# Patient Record
Sex: Male | Born: 1985 | Race: Black or African American | Hispanic: No | Marital: Single | Smoking: Former smoker
Health system: Southern US, Community
[De-identification: ages and names within clinical notes are randomized; demographics above are authoritative.]

## PROBLEM LIST (undated history)

## (undated) DIAGNOSIS — J40 Bronchitis, not specified as acute or chronic: Secondary | ICD-10-CM

## (undated) DIAGNOSIS — L0291 Cutaneous abscess, unspecified: Secondary | ICD-10-CM

## (undated) DIAGNOSIS — F32A Depression, unspecified: Secondary | ICD-10-CM

## (undated) DIAGNOSIS — T7840XA Allergy, unspecified, initial encounter: Secondary | ICD-10-CM

## (undated) DIAGNOSIS — M5441 Lumbago with sciatica, right side: Secondary | ICD-10-CM

## (undated) DIAGNOSIS — F419 Anxiety disorder, unspecified: Secondary | ICD-10-CM

## (undated) DIAGNOSIS — M5442 Lumbago with sciatica, left side: Secondary | ICD-10-CM

## (undated) HISTORY — DX: Anxiety disorder, unspecified: F41.9

## (undated) HISTORY — DX: Allergy, unspecified, initial encounter: T78.40XA

## (undated) HISTORY — DX: Depression, unspecified: F32.A

---
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
## 2007-02-28 ENCOUNTER — Emergency Department (HOSPITAL_COMMUNITY): Admission: EM | Admit: 2007-02-28 | Discharge: 2007-02-28 | Payer: Self-pay | Admitting: Emergency Medicine

## 2007-08-22 ENCOUNTER — Emergency Department (HOSPITAL_COMMUNITY): Admission: EM | Admit: 2007-08-22 | Discharge: 2007-08-22 | Payer: Self-pay | Admitting: Emergency Medicine

## 2008-06-20 ENCOUNTER — Emergency Department (HOSPITAL_COMMUNITY): Admission: EM | Admit: 2008-06-20 | Discharge: 2008-06-20 | Payer: Self-pay | Admitting: Family Medicine

## 2009-05-17 ENCOUNTER — Emergency Department (HOSPITAL_COMMUNITY): Admission: EM | Admit: 2009-05-17 | Discharge: 2009-05-17 | Payer: Self-pay | Admitting: Family Medicine

## 2009-09-19 ENCOUNTER — Emergency Department (HOSPITAL_COMMUNITY): Admission: EM | Admit: 2009-09-19 | Discharge: 2009-09-19 | Payer: Self-pay | Admitting: Emergency Medicine

## 2010-09-10 LAB — POCT URINALYSIS DIP (DEVICE)
Bilirubin Urine: NEGATIVE
Glucose, UA: NEGATIVE mg/dL
Hgb urine dipstick: NEGATIVE
Ketones, ur: NEGATIVE mg/dL
Nitrite: NEGATIVE
Protein, ur: NEGATIVE mg/dL
Specific Gravity, Urine: 1.015 (ref 1.005–1.030)
Urobilinogen, UA: 0.2 mg/dL (ref 0.0–1.0)
pH: 8 (ref 5.0–8.0)

## 2010-11-20 ENCOUNTER — Inpatient Hospital Stay (INDEPENDENT_AMBULATORY_CARE_PROVIDER_SITE_OTHER)
Admission: RE | Admit: 2010-11-20 | Discharge: 2010-11-20 | Disposition: A | Discharge status: Home / Self Care | Payer: Self-pay | Source: Ambulatory Visit | Attending: Emergency Medicine | Admitting: Emergency Medicine

## 2010-11-20 DIAGNOSIS — L03317 Cellulitis of buttock: Secondary | ICD-10-CM

## 2010-11-20 DIAGNOSIS — L0231 Cutaneous abscess of buttock: Secondary | ICD-10-CM

## 2010-12-24 ENCOUNTER — Emergency Department (HOSPITAL_COMMUNITY)
Admission: EM | Admit: 2010-12-24 | Discharge: 2010-12-25 | Disposition: A | Discharge status: Home / Self Care | Payer: Self-pay | Attending: Emergency Medicine | Admitting: Emergency Medicine

## 2010-12-24 DIAGNOSIS — R51 Headache: Secondary | ICD-10-CM | POA: Insufficient documentation

## 2010-12-24 DIAGNOSIS — R1110 Vomiting, unspecified: Secondary | ICD-10-CM | POA: Insufficient documentation

## 2010-12-24 LAB — URINALYSIS, ROUTINE W REFLEX MICROSCOPIC
Glucose, UA: NEGATIVE mg/dL
Ketones, ur: NEGATIVE mg/dL
Leukocytes, UA: NEGATIVE
Nitrite: NEGATIVE
Protein, ur: NEGATIVE mg/dL
pH: 6 (ref 5.0–8.0)

## 2010-12-25 ENCOUNTER — Emergency Department (HOSPITAL_COMMUNITY): Payer: Self-pay

## 2010-12-26 ENCOUNTER — Inpatient Hospital Stay (INDEPENDENT_AMBULATORY_CARE_PROVIDER_SITE_OTHER)
Admission: RE | Admit: 2010-12-26 | Discharge: 2010-12-26 | Disposition: A | Discharge status: Home / Self Care | Payer: Self-pay | Source: Ambulatory Visit | Attending: Emergency Medicine | Admitting: Emergency Medicine

## 2010-12-26 DIAGNOSIS — K612 Anorectal abscess: Secondary | ICD-10-CM

## 2010-12-29 LAB — CULTURE, ROUTINE-ABSCESS

## 2011-02-18 LAB — URINALYSIS, ROUTINE W REFLEX MICROSCOPIC
Leukocytes, UA: NEGATIVE
Nitrite: NEGATIVE
Protein, ur: 30 — AB
Specific Gravity, Urine: 1.035 — ABNORMAL HIGH
Urobilinogen, UA: 1

## 2011-02-18 LAB — URINE MICROSCOPIC-ADD ON

## 2011-03-07 LAB — CULTURE, ROUTINE-ABSCESS

## 2011-10-18 ENCOUNTER — Encounter (HOSPITAL_COMMUNITY): Payer: Self-pay | Admitting: Physical Medicine and Rehabilitation

## 2011-10-18 ENCOUNTER — Emergency Department (HOSPITAL_COMMUNITY)
Admission: EM | Admit: 2011-10-18 | Discharge: 2011-10-18 | Disposition: A | Discharge status: Home / Self Care | Payer: Self-pay | Attending: Emergency Medicine | Admitting: Emergency Medicine

## 2011-10-18 DIAGNOSIS — F17200 Nicotine dependence, unspecified, uncomplicated: Secondary | ICD-10-CM | POA: Insufficient documentation

## 2011-10-18 DIAGNOSIS — S6390XA Sprain of unspecified part of unspecified wrist and hand, initial encounter: Secondary | ICD-10-CM | POA: Insufficient documentation

## 2011-10-18 DIAGNOSIS — S66919A Strain of unspecified muscle, fascia and tendon at wrist and hand level, unspecified hand, initial encounter: Secondary | ICD-10-CM

## 2011-10-18 DIAGNOSIS — X58XXXA Exposure to other specified factors, initial encounter: Secondary | ICD-10-CM | POA: Insufficient documentation

## 2011-10-18 MED ORDER — NAPROXEN 500 MG PO TABS: 500.0000 mg | ORAL_TABLET | Freq: Two times a day (BID) | ORAL | Status: DC

## 2011-10-18 NOTE — Discharge Instructions (Signed)
Repetitive Strain Injuries  Repetitive strain injuries (RSIs) are now the single largest cause of job related (occupational) health problems in the [place] RSIs can occur in any job that requires repetitive, forceful, or awkward motions. Repetitive strain injuries are a group of health problems that result from several causes. These include overuse or misuse of muscles, cord-like structures attaching muscle to bone (tendons), and nerves.  CAUSES   Job-related RSIs are caused by any combination of the following factors:   Fast pace (working quickly).   Repetitive tasks (making the same motion over and over).   Awkward or fixed posture (working in an awkward position or holding the same position for a long time).   Forceful movements (lifting, pulling, or pushing).   Vibration (caused by power tools).   Working in cold temperatures.   Job stress (such as monitoring).   Inadequate rest breaks (overuse).  RSIs develop over time and are also called cumulative trauma disorders (CTDs). Repetitive strain injuries have other names, too. These include:   Occupational overuse syndrome.   Repetitive motion disorders.   Repetitive damage caused by an accident (trauma) disorders (RTDs).  RSIs can affect almost any part of the body. But they often occur in the upper body. The most commonly affected body parts are:    Fingers.   Elbows.   Hands.   Shoulders.   Wrists.   Back.   Arms.   Neck.  RISK INCREASES WITH:  You may be at risk for developing an RSI, if you:   Have poor posture.   Have poor technique.   Use a computer more than two to four hours a day.   Have a job that requires constant computer use.   Do not take frequent breaks.   Are loose-jointed.   Do not exercise regularly.   Work in a high pressure environment.   Have arthritis, diabetes, or another serious medical condition.   Keep your fingernails long.   Have an unhealthy, stressful, or inactive lifestyle.   Weigh more than you  should.   Do not sleep well.   Are unwilling to ask for a better work setting, chair, desk, etc.   Are macho, and don't believe you are at risk when you really are, or think that you can just "take it."  SYMPTOMS   These problems (symptoms) may appear in any order, and at any stage in the development of the injury. Symptoms may occur at any time: during work, right after work, or many hours or days after work. Many people first experience symptoms when they are not working. For example, an injured worker may have no pain at work, but may wake up at night with a painful shoulder or elbow.  The most common symptoms are:   Burning, shooting, or aching pain, especially in the fingers, palms, wrists, forearms, or shoulders.   Tenderness.   Swelling.   Tingling, numbness, or loss of feeling.   Loss of joint mobility (difficulty moving the wrist or elbow, for example).   Weakness, heaviness, or loss of coordination in the hand (for example, difficulty opening a jar top).   Crackling.   Muscle spasms.   Decreased coordination, or clumsiness (for example, dropping things often).   Avoiding the use of one hand or arm, because it is painful, and preferring the other.   Difficulty doing simple things like handling keys, chopping food, putting on jewelry, writing, or brushing teeth.  Any jobs that require strain and repetition pose a   risk of RSI. There are many different repetitive strain injuries, since many different parts of the body can be affected. RSI symptoms can be mild. But they can become so intense that it becomes difficult to perform everyday tasks. These tasks include opening a jar or fastening a button. In general, the more intense and frequent the symptoms, the more serious the RSI is likely to be. A serious RSI can develop only months after symptoms first appear, or it could take years to develop.   Most RSIs are work-related. But RSIs can be caused by activities outside of work, such as sports and  hobbies. Older people are more vulnerable than younger ones to RSIs. This is because the body's ability to repair the effects of wear and tear decreases with age.   If you think your repetitive strain disorder is getting worse, or you are developing one, see your caregiver for advice. Often, if treated early, the amount and length of disability can be shortened.  HOME CARE INSTRUCTIONS   If your caregiver prescribed medicine to help reduce swelling, take as directed.   If you were given a splint to keep your wrist from bending (such as for carpal tunnel syndrome), use it as instructed. It is important to wear the splint at night. Use splints for as long as your caregiver recommends.   It is important to give your injury a rest by stopping the activities that are causing the problem. If your symptoms are work-related, you may need to talk to your employer about changing to a job that does not require using your injured part.   Only take over-the-counter or prescription medicines for pain, discomfort, or fever as directed by your caregiver.   Following periods of extended use, especially strenuous use, apply an ice pack wrapped in a towel to the affected (sore) area for 15 to 20 minutes. Repeat as needed, three to four times per day. This will help reduce the swelling.   Call your caregiver if you develop new, unexplained symptoms or problems that are not responding to medicines.  When non-surgical treatment does not help, surgery may be required. Non-surgical treatment could include:   Changes in the activity that caused the problem or made it worse.   Medicines to stop the swelling and soreness (anti-inflammatory medications).   Injections such as cortisone, to decrease the inflammation and soreness.  Your caregiver will help you determine which is best for you.  PREVENTION  An RSI can take months, even years to develop, and it can take longer to heal. But there are ways to prevent RSIs,  including:   Maintain good posture at your desk or work station with:   Feet flat on the floor.   Knees directly over feet, bent at right angles (or slightly greater), a couple inches of space away from the chair.   Pelvis rocked forward, sitting on the "sitz bones," with hips no lower than, and perhaps slightly higher than the knees.   Lower back arched in, and supported by your chair, or a towel/foam roll wedged against your back.   Upper back naturally rounded.   Shoulders and arms relaxed, and at your side.   Neck arched in, relaxed, and supported by your spine. Do not hold tension in your back or under your chin.   Head balancing gently on top of your spine.   A good quality, adjustable chair with a firm seat.   Set up your work station well, so that it reduces   strain. Make sure your:   Keyboard is above your thighs. You are able to reach the keys, with your elbows at your side and bent at (slightly greater than) 90 degrees, and your forearms are about parallel to the ground.   Mouse is just to one side of your keyboard. You do not need to lean, stretch, or hunch to work it. Many people have one shoulder lower than the other. This can be caused by repetitive stretching for a mouse.   Monitor is directly in front of you (not off to the side), so that your eyes are somewhere between the top of the screen and one fifth of the way down from the top. The screen should be about 15 to 25 inches from your eyes.   Take breaks often, at least once an hour. Get up, walk around, and stretch.   Exercise regularly.   Only use your computer as much as you need to for work. Do not use it during breaks.   Do not hold your pen tightly when writing. It should be possible to pull the pen from your fingers, easily.   Let your hands float above the keyboard when you type. Move your entire arm when moving your mouse or typing hard-to-reach keys. Always keep your wrist joint straight. This lets the big muscles in  your arm, shoulder, and back do the work, instead of the smaller and weaker ones in your hand and wrist.  Document Released: 05/03/2002 Document Revised: 05/02/2011 Document Reviewed: 03/20/2009  [employer] Patient Information 2012 [employer].

## 2011-10-18 NOTE — ED Notes (Signed)
Pt presents to department for evaluation of R hand pain and swelling. Recently started new job working outside. 7/10 pain throbbing pain upon arrival. No obvious deformities noted. He is alert and oriented x4. No signs of distress,

## 2011-10-18 NOTE — ED Provider Notes (Signed)
History     CSN: [id number]  Arrival date & time 10/18/11  1514   First MD Initiated Contact with Patient 10/18/11 [phone]      Chief Complaint  Patient presents with  . Hand Pain    (Consider location/radiation/quality/duration/timing/severity/associated sxs/prior treatment) Patient is a 26 y.o. male presenting with hand pain. The history is provided by the patient. No language interpreter was used.  Hand Pain This is a new problem. The current episode started 1 to 4 weeks ago. The problem occurs 2 to 4 times per day. The problem has been gradually worsening. Associated symptoms include arthralgias and weakness. Pertinent negatives include no fever or numbness. He has tried ice and heat for the symptoms. The treatment provided mild relief.  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  Patient is also a [employer]or, [employer].  No past medical history on file.  No past surgical history on file.  History reviewed. No pertinent family history.  History  Substance Use Topics  . Smoking status: Current Everyday Smoker -- 1.0 packs/day    Types: Cigarettes  . Smokeless tobacco: Not on file  . Alcohol Use: No      Review of Systems  Constitutional: Negative for fever.  Musculoskeletal: Positive for arthralgias.  Neurological: Positive for weakness. Negative for numbness.  All other systems reviewed and are negative.    Allergies  Review of patient's allergies indicates no known allergies.  Home Medications   Current Outpatient Rx  Name Route Sig Dispense Refill  . DIPHENHYDRAMINE HCL 25 MG PO TABS Oral Take 25 mg by mouth at bedtime as needed. For seasonal allergies    . NAPHAZOLINE-GLYCERIN 0.012-0.2 % OP SOLN Both Eyes Place 1 drop into both eyes every 4 (four) hours as needed. For seasonal allergies       BP 117/77  Pulse 59  Temp(Src) 98 F (36.7 C) (Oral)  Resp 18  SpO2 99%  Physical Exam  Nursing note and vitals reviewed. Constitutional: He is oriented to person, place, and time. He appears well-developed and well-nourished. No distress.  HENT:  Head: Normocephalic and atraumatic.  Eyes: Pupils are equal, round, and reactive to light.  Neck: Normal range of motion. Neck supple.  Cardiovascular: Normal rate, regular rhythm, normal heart sounds and intact distal pulses.   Pulmonary/Chest: Effort normal and breath sounds normal.  Abdominal: Soft. Bowel sounds are normal.  Musculoskeletal: He exhibits edema and tenderness.       Right hand: He exhibits tenderness and swelling. He exhibits normal range of motion, normal capillary refill and no deformity. normal sensation noted.       Hands: Lymphadenopathy:    He has no cervical adenopathy.  Neurological: He is alert and oriented to person, place, and time.  Skin: Skin is warm and dry. No rash noted. No erythema.  Psychiatric: He has a normal mood and affect. His behavior is normal. Judgment and thought content normal.    ED Course  Procedures (including critical care time)  Labs Reviewed - No data to display No results found.   No diagnosis found.  Hand strain d/t repetitive motion  MDM          Jimmye Norman, NP 10/18/11 1623

## 2011-10-18 NOTE — ED Provider Notes (Signed)
Medical screening examination/treatment/procedure(s) were performed by non-physician practitioner and as supervising physician I was immediately available for consultation/collaboration.  Shelda Jakes, MD 10/18/11 [phone]

## 2012-01-15 ENCOUNTER — Emergency Department (HOSPITAL_COMMUNITY)
Admission: EM | Admit: 2012-01-15 | Discharge: 2012-01-15 | Disposition: A | Discharge status: Home / Self Care | Payer: Self-pay | Attending: Emergency Medicine | Admitting: Emergency Medicine

## 2012-01-15 ENCOUNTER — Encounter (HOSPITAL_COMMUNITY): Payer: Self-pay | Admitting: *Deleted

## 2012-01-15 DIAGNOSIS — F17200 Nicotine dependence, unspecified, uncomplicated: Secondary | ICD-10-CM | POA: Insufficient documentation

## 2012-01-15 DIAGNOSIS — L0231 Cutaneous abscess of buttock: Secondary | ICD-10-CM | POA: Insufficient documentation

## 2012-01-15 DIAGNOSIS — L03317 Cellulitis of buttock: Secondary | ICD-10-CM | POA: Insufficient documentation

## 2012-01-15 MED ORDER — HYDROCODONE-ACETAMINOPHEN 5-325 MG PO TABS
1.0000 | ORAL_TABLET | Freq: Once | ORAL | Status: AC
  Administered 2012-01-15: 1 via ORAL
  Filled 2012-01-15: qty 1

## 2012-01-15 MED ORDER — HYDROCODONE-ACETAMINOPHEN 5-325 MG PO TABS: ORAL_TABLET | ORAL | Status: DC

## 2012-01-15 MED ORDER — SULFAMETHOXAZOLE-TRIMETHOPRIM 800-160 MG PO TABS: 1.0000 | ORAL_TABLET | Freq: Two times a day (BID) | ORAL | Status: DC

## 2012-01-15 MED ORDER — LIDOCAINE-EPINEPHRINE (PF) 2 %-1:200000 IJ SOLN
10.0000 mL | Freq: Once | INTRAMUSCULAR | Status: AC
  Administered 2012-01-15: 10 mL

## 2012-01-15 NOTE — ED Notes (Signed)
Wound dressed with gauze and paper tape.

## 2012-01-15 NOTE — ED Notes (Signed)
Pt reports abscess to right buttock starting yesterday and worse today. Pt has tried using compresses without relief. Pt denies drainage. Pt reports history of similar issue.

## 2012-01-15 NOTE — ED Provider Notes (Signed)
History     CSN: [id number]  Arrival date & time 01/15/12  Ronnie Hoover   First MD Initiated Contact with Patient 01/15/12 1935      Chief Complaint  Patient presents with  . Abscess    (Consider location/radiation/quality/duration/timing/severity/associated sxs/prior treatment) HPI Comments: Patient present with complaint of right buttock abscess for the past 3 days. He has had a history of the same thing approximately one year ago. No drainage. No fever. No pain with defecation. No treatments prior to arrival. Nothing makes the symptoms better. Palpation and sitting makes the symptoms worse.  Patient is a 26 y.o. male presenting with abscess. The history is provided by the patient.  Abscess  This is a new problem. The current episode started less than one week ago. The onset was gradual. The problem occurs continuously. The problem has been gradually worsening. The abscess is present on the right buttock. The problem is moderate. The abscess is characterized by painfulness and swelling. Pertinent negatives include no fever, no diarrhea and no vomiting.    History reviewed. No pertinent past medical history.  History reviewed. No pertinent past surgical history.  No family history on file.  History  Substance Use Topics  . Smoking status: Current Everyday Smoker -- 1.0 packs/day    Types: Cigarettes  . Smokeless tobacco: Not on file  . Alcohol Use: No      Review of Systems  Constitutional: Negative for fever.  Gastrointestinal: Negative for nausea, vomiting and diarrhea.  Skin: Negative for color change.       Positive for abscess  Hematological: Negative for adenopathy.    Allergies  Review of patient's allergies indicates no known allergies.  Home Medications  No current outpatient prescriptions on file.  BP 127/66  Pulse 70  Temp 98.3 F (36.8 C) (Oral)  Resp 17  Wt 150 lb (68.04 kg)  SpO2 100%  Physical Exam  Nursing note and vitals  reviewed. Constitutional: He appears well-developed and well-nourished.  HENT:  Head: Normocephalic and atraumatic.  Eyes: Conjunctivae are normal.  Neck: Normal range of motion. Neck supple.  Pulmonary/Chest: No respiratory distress.  Neurological: He is alert.  Skin: Skin is warm and dry.       Patients with 6 cm x 2 cm area of induration with palpation of right medial buttocks. Area does not seem to involve the rectum or anus. There is no overlying redness or cellulitis. There is a small "head" in the area without drainage. Patient is very tender to palpation.  Psychiatric: He has a normal mood and affect.    ED Course  Procedures (including critical care time)  Labs Reviewed - No data to display No results found.   1. Abscess of buttock     8:11 PM Patient seen and examined. Will set up for I&D. Vicodin ordered.   Vital signs reviewed and are as follows: Filed Vitals:   01/15/12 1905  BP: 127/66  Pulse: 70  Temp: 98.3 F (36.8 C)  Resp: 17   INCISION AND DRAINAGE Performed by: Carolee Rota Consent: Verbal consent obtained. Risks and benefits: risks, benefits and alternatives were discussed Type: abscess  Body area: R buttocks  Anesthesia: local infiltration  Local anesthetic: lidocaine 2% with epinephrine  Anesthetic total: 3 ml  Complexity: complex Blunt dissection to break up loculations  Drainage: purulent  Drainage amount: large  Packing material: 1/4 in iodoform gauze  Patient tolerance: Patient tolerated the procedure well with no immediate complications.  Patient counseled on wound  care. Patient told to return in 2 days for packing removal and recheck. Patient told to return sooner with worsening pain, swelling, fever.  Patient counseled on use of narcotic pain medications. Counseled not to combine these medications with others containing tylenol. Urged not to drink alcohol, drive, or perform any other activities that requires focus while  taking these medications. The patient verbalizes understanding and agrees with the plan.  9:11 PM Patient counseled on use of narcotic pain medications. Counseled not to combine these medications with others containing tylenol. Urged not to drink alcohol, drive, or perform any other activities that requires focus while taking these medications. The patient verbalizes understanding and agrees with the plan.    MDM  Patient with skin abscess amenable to incision and drainage.  Abscess was large enough to warrant packing with removal and wound recheck in 2 days. No signs of cellulitis is surrounding skin.  Will d/c to home.  Antibiotic therapy is indicated given size of abscess.  Do not suspect perianal/perirectal involvement.         Renne Crigler, [place] 01/15/12 2111

## 2012-01-15 NOTE — ED Notes (Signed)
Pt placed in gown.

## 2012-01-18 ENCOUNTER — Encounter (HOSPITAL_COMMUNITY): Payer: Self-pay | Admitting: Family Medicine

## 2012-01-18 ENCOUNTER — Emergency Department (HOSPITAL_COMMUNITY)
Admission: EM | Admit: 2012-01-18 | Discharge: 2012-01-18 | Disposition: A | Discharge status: Home / Self Care | Payer: Self-pay | Attending: Emergency Medicine | Admitting: Emergency Medicine

## 2012-01-18 DIAGNOSIS — Z5189 Encounter for other specified aftercare: Secondary | ICD-10-CM

## 2012-01-18 DIAGNOSIS — L0231 Cutaneous abscess of buttock: Secondary | ICD-10-CM | POA: Insufficient documentation

## 2012-01-18 DIAGNOSIS — F17200 Nicotine dependence, unspecified, uncomplicated: Secondary | ICD-10-CM | POA: Insufficient documentation

## 2012-01-18 DIAGNOSIS — L03317 Cellulitis of buttock: Secondary | ICD-10-CM | POA: Insufficient documentation

## 2012-01-18 NOTE — ED Provider Notes (Signed)
History     CSN: [id number]  Arrival date & time 01/18/12  1859   First MD Initiated Contact with Patient 01/18/12 1936      Chief Complaint  Patient presents with  . Wound Check    (Consider location/radiation/quality/duration/timing/severity/associated sxs/prior treatment) Patient is a 26 y.o. male presenting with wound check. The history is provided by the patient and medical records.  Wound Check    Ronnie Hoover is a 26 y.o. male presents to the emergency department 48 hours after incision and drainage of a buttock abscess here in the emergency department.  Patient states the packing came out this morning in the shower.  He states that the pain is decreased and he is still taking the antibiotic.  Small amount of drainage still occurring.  History reviewed. No pertinent past medical history.  History reviewed. No pertinent past surgical history.  No family history on file.  History  Substance Use Topics  . Smoking status: Current Everyday Smoker -- 1.0 packs/day    Types: Cigarettes  . Smokeless tobacco: Not on file  . Alcohol Use: No      Review of Systems  Constitutional: Negative for fever and fatigue.  Gastrointestinal: Negative for nausea, vomiting, abdominal pain and diarrhea.  Genitourinary: Negative for scrotal swelling.  Skin:       Abscess  Hematological: Negative for adenopathy.    Allergies  Review of patient's allergies indicates no known allergies.  Home Medications   Current Outpatient Rx  Name Route Sig Dispense Refill  . DIPHENHYDRAMINE HCL 25 MG PO CAPS Oral Take 25 mg by mouth every 6 (six) hours as needed.    Marland Kitchen HYDROCODONE-ACETAMINOPHEN 5-325 MG PO TABS  1 tablet every 6 (six) hours as needed. Take 1-2 tablets every 6 hours as needed for severe pain    . SULFAMETHOXAZOLE-TRIMETHOPRIM 800-160 MG PO TABS Oral Take 1 tablet by mouth 2 (two) times daily.      BP 123/68  Pulse 73  Temp 98.5 F (36.9 C) (Oral)  Resp 18  SpO2  100%  Physical Exam  Nursing note and vitals reviewed. Constitutional: He appears well-developed and well-nourished. No distress.  HENT:  Head: Normocephalic and atraumatic.  Eyes: Conjunctivae are normal. No scleral icterus.  Cardiovascular: Normal rate and regular rhythm.   Pulmonary/Chest: Effort normal and breath sounds normal.  Neurological: He is alert.  Skin: Skin is warm and dry. He is not diaphoretic.       4 x 2 area of induration with palpation of the right medial buttocks.  1 cm laceration remained open. There is no overlying redness or cellulitis. Small amount of drainage expressed from the site.    ED Course  Procedures (including critical care time)  Labs Reviewed - No data to display No results found.   1. Wound check, abscess       MDM  Ilene Qua presents for 48-hour wound check.  He is conscious alert and oriented, nontoxic nonseptic appearing. He has a no fever or signs of systemic illness. The area of induration is decreasing in size.  Advised him to continue his antibiotics.  I have also discussed reasons to return immediately to the ER.  Patient states understanding.    1. Medications: Antibiotics 2. Treatment: Continue warm sitz baths 3. Follow Up: As needed in the emergency department if symptoms worsen         Dierdre Forth, PA-C 01/18/12 2015

## 2012-01-18 NOTE — ED Notes (Signed)
Patient states that he is here for wound . States need wound to right buttock checked.

## 2012-01-19 NOTE — ED Provider Notes (Signed)
Medical screening examination/treatment/procedure(s) were performed by non-physician practitioner and as supervising physician I was immediately available for consultation/collaboration.   Lyanne Co, MD 01/19/12 [phone]

## 2012-07-18 ENCOUNTER — Encounter (HOSPITAL_COMMUNITY): Payer: Self-pay | Admitting: Emergency Medicine

## 2012-07-18 ENCOUNTER — Emergency Department (HOSPITAL_COMMUNITY)
Admission: EM | Admit: 2012-07-18 | Discharge: 2012-07-18 | Disposition: A | Discharge status: Home / Self Care | Payer: Self-pay | Attending: Emergency Medicine | Admitting: Emergency Medicine

## 2012-07-18 DIAGNOSIS — F17200 Nicotine dependence, unspecified, uncomplicated: Secondary | ICD-10-CM | POA: Insufficient documentation

## 2012-07-18 DIAGNOSIS — L0231 Cutaneous abscess of buttock: Secondary | ICD-10-CM | POA: Insufficient documentation

## 2012-07-18 MED ORDER — DOXYCYCLINE HYCLATE 100 MG PO CAPS: 100.0000 mg | ORAL_CAPSULE | Freq: Two times a day (BID) | ORAL | Status: DC

## 2012-07-18 MED ORDER — HYDROMORPHONE HCL PF 2 MG/ML IJ SOLN
2.0000 mg | Freq: Once | INTRAMUSCULAR | Status: AC
  Administered 2012-07-18: 2 mg via INTRAMUSCULAR
  Filled 2012-07-18: qty 1

## 2012-07-18 MED ORDER — HYDROCODONE-ACETAMINOPHEN 5-325 MG PO TABS: 1.0000 | ORAL_TABLET | Freq: Four times a day (QID) | ORAL | Status: DC | PRN

## 2012-07-18 NOTE — ED Notes (Signed)
Pt states that two days ago he noticed boil/abcess like on left buttock going towards crack.  Pt states that the pain has gotten worse. Denies drainage from area.

## 2012-07-18 NOTE — ED Provider Notes (Signed)
Medical screening examination/treatment/procedure(s) were conducted as a shared visit with non-physician practitioner(s) and myself.  I personally evaluated the patient during the encounter.  I and D,  per PA  Donnetta Hutching, MD 07/18/12 [phone]

## 2012-07-18 NOTE — ED Provider Notes (Signed)
History     CSN: [id number]  Arrival date & time 07/18/12  1225   First MD Initiated Contact with Patient 07/18/12 1251      Chief Complaint  Patient presents with  . Recurrent Skin Infections    (Consider location/radiation/quality/duration/timing/severity/associated sxs/prior treatment) HPI Patient is a 27 yo male who presents with a left buttock abscess.  He has had this before his last one a year ago which was drained.  For the past two days he's been feeling like it's coming back and complains of pain on the left buttock next to the anus.  Denies any fever, drainage,  or shortness of breath.    History reviewed. No pertinent past medical history.  History reviewed. No pertinent past surgical history.  No family history on file.  History  Substance Use Topics  . Smoking status: Current Every Day Smoker -- 1.00 packs/day    Types: Cigarettes  . Smokeless tobacco: Not on file  . Alcohol Use: No      Review of Systems All other systems negative except as documented in the HPI. All pertinent positives and negatives as reviewed in the HPI.  Allergies  Review of patient's allergies indicates no known allergies.  Home Medications  No current outpatient prescriptions on file.  BP 108/61  Pulse 80  Temp(Src) 98.4 F (36.9 C) (Oral)  Resp 17  Ht 6' (1.829 m)  SpO2 98%  Physical Exam  Nursing note and vitals reviewed. Constitutional: He is oriented to person, place, and time. He appears well-developed and well-nourished.  HENT:  Head: Normocephalic and atraumatic.  Eyes: Right eye exhibits no discharge. Left eye exhibits no discharge.  Neck: Normal range of motion.  Pulmonary/Chest: Effort normal.  Genitourinary:     Neurological: He is alert and oriented to person, place, and time.  Psychiatric: He has a normal mood and affect. His behavior is normal. Judgment and thought content normal.    ED Course  Procedures (including critical care  time)   INCISION AND DRAINAGE Performed by: Carlyle Dolly Consent: Verbal consent obtained. Risks and benefits: risks, benefits and alternatives were discussed Type: abscess  Body area: Left buttock medial  Anesthesia: local infiltration  Incision was made with a scalpel.  Local anesthetic: lidocaine2% w epinephrine  Anesthetic total: 7ml  Complexity: complex Blunt dissection to break up loculations  Drainage: purulent  Drainage amount: Large  Packing material: 1/4 in iodoform gauze  Patient tolerance: Patient tolerated the procedure well with no immediate complications.    Patient is advised to return here for any worsening in his condition.  Advised patient to return here in 2 days for recheck.  He is advised to keep the area covered and .  Warm compresses and heat around the area          [employer], PA-C 07/18/12 1527

## 2012-08-21 ENCOUNTER — Emergency Department (HOSPITAL_COMMUNITY)
Admission: EM | Admit: 2012-08-21 | Discharge: 2012-08-21 | Disposition: A | Discharge status: Home / Self Care | Payer: Self-pay | Attending: Emergency Medicine | Admitting: Emergency Medicine

## 2012-08-21 ENCOUNTER — Encounter (HOSPITAL_COMMUNITY): Payer: Self-pay | Admitting: Emergency Medicine

## 2012-08-21 DIAGNOSIS — L0231 Cutaneous abscess of buttock: Secondary | ICD-10-CM | POA: Insufficient documentation

## 2012-08-21 DIAGNOSIS — L0291 Cutaneous abscess, unspecified: Secondary | ICD-10-CM

## 2012-08-21 DIAGNOSIS — F17200 Nicotine dependence, unspecified, uncomplicated: Secondary | ICD-10-CM | POA: Insufficient documentation

## 2012-08-21 MED ORDER — TRAMADOL HCL 50 MG PO TABS: 50.0000 mg | ORAL_TABLET | Freq: Four times a day (QID) | ORAL | Status: DC | PRN

## 2012-08-21 MED ORDER — CEPHALEXIN 500 MG PO CAPS: 500.0000 mg | ORAL_CAPSULE | Freq: Four times a day (QID) | ORAL | Status: DC

## 2012-08-21 MED ORDER — ONDANSETRON 8 MG PO TBDP
8.0000 mg | ORAL_TABLET | Freq: Once | ORAL | Status: AC
  Administered 2012-08-21: 8 mg via ORAL
  Filled 2012-08-21: qty 1

## 2012-08-21 MED ORDER — SULFAMETHOXAZOLE-TRIMETHOPRIM 800-160 MG PO TABS: 1.0000 | ORAL_TABLET | Freq: Two times a day (BID) | ORAL | Status: DC

## 2012-08-21 MED ORDER — HYDROCODONE-ACETAMINOPHEN 5-325 MG PO TABS
2.0000 | ORAL_TABLET | Freq: Once | ORAL | Status: AC
  Administered 2012-08-21: 2 via ORAL
  Filled 2012-08-21: qty 2

## 2012-08-21 NOTE — ED Notes (Signed)
Has boil starting on right buttock-- had one lanced in February on left buttock. Hot/red/no drainage noted

## 2012-08-21 NOTE — ED Provider Notes (Signed)
History     CSN: [id number]  Arrival date & time 08/21/12  1115   First MD Initiated Contact with Patient 08/21/12 1136      Chief Complaint  Patient presents with  . 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 He states that he would like a surgery referral to get the core taken out of these areas and hopefully prevent recurrence. Currently he is in pain worse when he sits back because that places pressure on the abscess. No fever, chills, body aches, abdominal pain, nausea, vomiting, diarrhea.   Patient is a 27 y.o. male presenting with abscess. The history is provided by the patient. No language interpreter was used.  Abscess Location:  Ano-genital Ano-genital abscess location:  Gluteal cleft Size:  3 cm  Abscess quality: induration, painful, redness and warmth   Abscess quality: not draining, no itching and not weeping   Red streaking: no   Duration:  2 days Progression:  Worsening Pain details:    Quality:  Pressure and sharp   Duration:  3 days   Timing:  Constant   Progression:  Worsening Chronicity:  Recurrent Context: not diabetes and not injected drug use   Relieved by:  Nothing Worsened by:  Nothing tried Ineffective treatments:  None tried Associated symptoms: no anorexia, no fatigue, no fever, no headaches, no nausea and no vomiting   Risk factors: prior abscess     History reviewed. No pertinent past medical history.  History reviewed. No pertinent past surgical history.  History reviewed. No pertinent family history.  History  Substance Use Topics  . Smoking status: Current Every Day Smoker -- 1.00 packs/day    Types: Cigarettes  . Smokeless tobacco: Not on file  . Alcohol Use: Yes     Comment: occasionally      Review of Systems  Constitutional: Negative  for fever and fatigue.  Gastrointestinal: Negative for nausea, vomiting, abdominal pain and anorexia.  Genitourinary: Negative for dysuria.  Neurological: Negative for headaches.  All other systems reviewed and are negative.    Allergies  Review of patient's allergies indicates no known allergies.  Home Medications   Current Outpatient Rx  Name  Route  Sig  Dispense  Refill  . doxycycline (VIBRAMYCIN) 100 MG capsule   Oral   Take 1 capsule (100 mg total) by mouth 2 (two) times daily.   20 capsule   0   . HYDROcodone-acetaminophen (NORCO/VICODIN) 5-325 MG per tablet   Oral   Take 1 tablet by mouth every 6 (six) hours as needed for pain.   15 tablet   0     BP 114/75  Pulse 82  Temp(Src) 98.4 F (36.9 C) (Oral)  Resp 14  SpO2 99%  Physical Exam  Nursing note and vitals reviewed. Constitutional: He is oriented to person, place, and time. He appears well-developed and well-nourished. No distress.  HENT:  Head: Normocephalic and atraumatic.  Right Ear: External ear normal.  Left Ear: External ear normal.  Nose: Nose normal.  Eyes: Conjunctivae are normal.  Neck: Normal range of motion. No tracheal deviation present.  Cardiovascular: Normal rate, regular rhythm and normal heart sounds.   Pulmonary/Chest: Effort normal and breath sounds normal. No stridor.  Abdominal: Soft. He exhibits no distension. There is no tenderness.  Musculoskeletal: Normal range of motion.  Neurological: He is alert and oriented to person,  place, and time.  Skin: Skin is warm and dry. He is not diaphoretic.  3cm area of induration, little fluctuance on right buttock next to gluteal fold - no rectal involvement   Area [place] - <5cm collection of pus - moderately deep   Psychiatric: He has a normal mood and affect. His behavior is normal.    ED Course  Procedures (including critical care time)  Labs Reviewed - No data to display No results found.  INCISION AND DRAINAGE Performed by:  Junious Silk Consent: Verbal consent obtained. Risks and benefits: risks, benefits and alternatives were discussed Type: abscess Abscess was visualized on [place], < 5 cm  Body area: right buttock, close to gluteal cleft  Anesthesia: local infiltration  Incision was made with a scalpel.  Local anesthetic: lidocaine 2%   Anesthetic total: 5 ml  Complexity: complex Blunt dissection to break up loculations  Drainage: purulent  Drainage amount: moderate  Patient tolerance: Patient tolerated the procedure well with no immediate complications.    1. Abscess       MDM  Patient presented today for recurrent abscess. Abscess was in right buttock - no rectal involvement. Visualized on [place] - <5 cm, no indication to pack. I&D performed. Patient tolerated procedure well. Covered with Keflex and Bactrim because area was close to rectum. Discussed methods to keep area clean. Referral to surgery given per patient's request. Return precautions given. Patient / Family / Caregiver informed of clinical course, understand medical decision-making process, and agree with plan.       Mora Bellman, PA-C 08/22/12 1019

## 2012-08-22 NOTE — ED Provider Notes (Signed)
Patient presents for evaluation of recurrent buttocks, abscess.  Exam: Fluctuant mass, upper right buttock, medial; it does not extend toward the anus.  Assessment: Cutaneous abscess. This is amenable to treatment in the ED with, I&D.   Medical screening examination/treatment/procedure(s) were conducted as a shared visit with non-physician practitioner(s) and myself.  I personally evaluated the patient during the encounter  Flint Melter, MD 08/22/12 [phone]

## 2012-08-23 LAB — WOUND CULTURE
Culture: NO GROWTH
Gram Stain: NONE SEEN
Special Requests: NORMAL

## 2013-02-11 ENCOUNTER — Emergency Department (HOSPITAL_COMMUNITY)
Admission: EM | Admit: 2013-02-11 | Discharge: 2013-02-11 | Disposition: A | Discharge status: Home / Self Care | Payer: Self-pay | Attending: Emergency Medicine | Admitting: Emergency Medicine

## 2013-02-11 ENCOUNTER — Encounter (HOSPITAL_COMMUNITY): Payer: Self-pay | Admitting: *Deleted

## 2013-02-11 ENCOUNTER — Emergency Department (HOSPITAL_COMMUNITY): Payer: Self-pay

## 2013-02-11 DIAGNOSIS — R42 Dizziness and giddiness: Secondary | ICD-10-CM | POA: Insufficient documentation

## 2013-02-11 DIAGNOSIS — J069 Acute upper respiratory infection, unspecified: Secondary | ICD-10-CM | POA: Insufficient documentation

## 2013-02-11 DIAGNOSIS — F17200 Nicotine dependence, unspecified, uncomplicated: Secondary | ICD-10-CM | POA: Insufficient documentation

## 2013-02-11 DIAGNOSIS — R079 Chest pain, unspecified: Secondary | ICD-10-CM | POA: Insufficient documentation

## 2013-02-11 LAB — RAPID STREP SCREEN (MED CTR MEBANE ONLY): Streptococcus, Group A Screen (Direct): NEGATIVE

## 2013-02-11 LAB — CBC
Hemoglobin: 15.7 g/dL (ref 13.0–17.0)
MCH: 33.7 pg (ref 26.0–34.0)
MCHC: 35.4 g/dL (ref 30.0–36.0)
Platelets: 235 10*3/uL (ref 150–400)
RDW: 13.3 % (ref 11.5–15.5)

## 2013-02-11 MED ORDER — PSEUDOEPHEDRINE HCL 60 MG PO TABS: 60.0000 mg | ORAL_TABLET | ORAL | Status: DC | PRN

## 2013-02-11 MED ORDER — BENZONATATE 100 MG PO CAPS: 100.0000 mg | ORAL_CAPSULE | Freq: Three times a day (TID) | ORAL | Status: DC

## 2013-02-11 NOTE — ED Notes (Signed)
Patient transported to X-ray 

## 2013-02-11 NOTE — ED Notes (Signed)
Pt c/o runny nose and sore throat.  This morning he developed a cough with chest pain when he coughs.  Also c/o dizziness at rest.  VS stable.

## 2013-02-11 NOTE — ED Provider Notes (Signed)
CSN: [id number]     Arrival date & time 02/11/13  1514 History  This chart was scribed for non-physician practitioner Jaynie Crumble, PA-C, working with Layla Maw Ward, DO by Dorothey Baseman, ED Scribe. This patient was seen in room TR08C/TR08C and the patient's care was started at 4:19 PM.    Chief Complaint  Patient presents with  . Sore Throat  . Cough   The history is provided by the patient. No language interpreter was used.   HPI Comments: Ronnie Hoover is a 27 y.o. male who presents to the Emergency Department complaining of sore throat and sinus congestion onset 4 days ago that has been progressively worsening. States this morning developed cough, dry.  He states that he has taken Benadryl at home with mild, temporary relief. He reports that he has been staying well-hydrated. He reports that he has been in contact with people at work that have been sick with similar symptoms. He reports associated dizziness, lightheadedness, rhinorrhea, cough, and chest pain. He reports feeling feverish, but did not measure his temperature.   History reviewed. No pertinent past medical history. History reviewed. No pertinent past surgical history. No family history on file. History  Substance Use Topics  . Smoking status: Current Every Day Smoker -- 1.00 packs/day    Types: Cigarettes  . Smokeless tobacco: Not on file  . Alcohol Use: Yes     Comment: occasionally    Review of Systems  Constitutional: Negative for fever.  HENT: Positive for sore throat and rhinorrhea.   Respiratory: Positive for cough.   Cardiovascular: Positive for chest pain.  Neurological: Positive for dizziness and light-headedness.  All other systems reviewed and are negative.    Allergies  Review of patient's allergies indicates no known allergies.  Home Medications   Current Outpatient Rx  Name  Route  Sig  Dispense  Refill  . diphenhydrAMINE (BENADRYL) 25 MG tablet   Oral   Take 50 mg by mouth every 6  (six) hours as needed for itching or allergies.         Marland Kitchen ibuprofen (ADVIL,MOTRIN) 200 MG tablet   Oral   Take 400 mg by mouth every 6 (six) hours as needed for pain.         . Multiple Vitamin (ONE-A-DAY MENS PO)   Oral   Take 1 tablet by mouth daily.          Triage Vitals: BP 107/73  Pulse 74  Temp(Src) 98 F (36.7 C) (Oral)  Resp 18  SpO2 98%  Physical Exam  Nursing note and vitals reviewed. Constitutional: He is oriented to person, place, and time. He appears well-developed and well-nourished. No distress.  HENT:  Head: Normocephalic and atraumatic.  Right Ear: External ear normal.  Left Ear: External ear normal.  Pharynx is erythematous. Tonsils are normal. No exudate. Uvula is midline.   Eyes: Conjunctivae are normal.  Neck: Normal range of motion. Neck supple.  Cardiovascular: Normal rate, regular rhythm and normal heart sounds.   No murmur heard. Pulmonary/Chest: Effort normal and breath sounds normal. No respiratory distress.  Musculoskeletal: Normal range of motion.  Lymphadenopathy:    He has no cervical adenopathy.  Neurological: He is alert and oriented to person, place, and time.  Skin: Skin is warm and dry.  Psychiatric: He has a normal mood and affect. His behavior is normal.    ED Course  Procedures (including critical care time)  DIAGNOSTIC STUDIES: Oxygen Saturation is 98% on room air, normal  by my interpretation.    COORDINATION OF CARE: 4:20PM- Discussed that labs and chest x-ray were normal. Discussed that symptoms are likely viral. Advised patient to stay out of work for a few days or until symptoms subside. Advised patient to take OTC decongestants, saline nasal spray, and stay hydrated. Advised patient to follow up if there are any new or worsening symptoms. Will discharge patient with cough medicine.    Labs Review Labs Reviewed  RAPID STREP SCREEN  CULTURE, GROUP A STREP  CBC   Imaging Review Dg Chest 2 View (if Patient Has  Fever And/or Copd)  02/11/2013   *RADIOLOGY REPORT*  Clinical Data: Fever, sore throat, chest pain  CHEST - 2 VIEW  Comparison: 08/22/2007  Findings: Cardiomediastinal silhouette is within normal limits. The lungs are clear. No pleural effusion.  No pneumothorax.  No acute osseous abnormality.  IMPRESSION: Normal chest.   Original Report Authenticated By: Christiana Pellant, M.D.    MDM   1. URI (upper respiratory infection)     Patient with upper respiratory symptoms for 4 days with worsening cough today. He is otherwise healthy is not taking any medications. He had a chest x-ray, CBC, strep ordered time of triage by a nurse. All these tests came back normal. Patient's exam is unremarkable. He is nontoxic appearing. His vital signs are normal. He is afebrile. He'll be discharged home with symptomatic treatment for his most likely viral upper respiratory tract infection. I will prescribe him Sudafed and Tessalon Perles. He's to followup with her primary care Dr.'  Ceasar Mons Vitals:   02/11/13 1521  BP: 107/73  Pulse: 74  Temp: 98 F (36.7 C)  TempSrc: Oral  Resp: 18  SpO2: 98%   I personally performed the services described in this documentation, which was scribed in my presence. The recorded information has been reviewed and is accurate.     Lottie Mussel, PA-C 02/11/13 1635

## 2013-02-14 LAB — CULTURE, GROUP A STREP

## 2013-02-22 NOTE — ED Provider Notes (Signed)
Medical screening examination/treatment/procedure(s) were performed by non-physician practitioner and as supervising physician I was immediately available for consultation/collaboration.   Layla Maw Ward, DO 02/22/13 [phone]

## 2013-03-17 ENCOUNTER — Encounter (HOSPITAL_COMMUNITY): Payer: Self-pay | Admitting: Emergency Medicine

## 2013-03-17 ENCOUNTER — Emergency Department (HOSPITAL_COMMUNITY)
Admission: EM | Admit: 2013-03-17 | Discharge: 2013-03-17 | Disposition: A | Discharge status: Home / Self Care | Payer: Self-pay | Attending: Emergency Medicine | Admitting: Emergency Medicine

## 2013-03-17 DIAGNOSIS — L0231 Cutaneous abscess of buttock: Secondary | ICD-10-CM | POA: Insufficient documentation

## 2013-03-17 DIAGNOSIS — F17200 Nicotine dependence, unspecified, uncomplicated: Secondary | ICD-10-CM | POA: Insufficient documentation

## 2013-03-17 DIAGNOSIS — Z79899 Other long term (current) drug therapy: Secondary | ICD-10-CM | POA: Insufficient documentation

## 2013-03-17 MED ORDER — HYDROCODONE-ACETAMINOPHEN 5-325 MG PO TABS: 1.0000 | ORAL_TABLET | Freq: Four times a day (QID) | ORAL | Status: DC | PRN

## 2013-03-17 MED ORDER — OXYCODONE-ACETAMINOPHEN 5-325 MG PO TABS
1.0000 | ORAL_TABLET | Freq: Once | ORAL | Status: AC
  Administered 2013-03-17: 1 via ORAL
  Filled 2013-03-17: qty 1

## 2013-03-17 NOTE — ED Provider Notes (Signed)
EKG Interpretation   None        Joylyn Duggin R Roslind Michaux, MD 03/17/13 2343 

## 2013-03-17 NOTE — ED Notes (Signed)
Pt states he has a ride home

## 2013-03-17 NOTE — ED Provider Notes (Signed)
CSN: [id number]     Arrival date & time 03/17/13  1324 History   None    Chief Complaint  Patient presents with  . Abscess   HPI Ronnie Hoover is a 27 yo male who presents with a painful cyst on his left buttock, near the 'crack.' He first noticed the spot over the weekend and the pain has been increasing since then. He denies bloody discharge or purulence from the cyst. He denies recent trauma to the area. No fever, chills, diarrhea, constipation, bloody stools, myalgia, or arthralgias. He recently had a negative STI workup. He has soaked in warm water with Epsom salts several times which eases the pain for a few hours before it flares back up.   History reviewed. No pertinent past medical history. No past surgical history on file. No family history on file. History  Substance Use Topics  . Smoking status: Current Every Day Smoker -- 1.00 packs/day    Types: Cigarettes  . Smokeless tobacco: Not on file  . Alcohol Use: Yes     Comment: occasionally    Review of Systems  Allergies  Review of patient's allergies indicates no known allergies.  Home Medications   Current Outpatient Rx  Name  Route  Sig  Dispense  Refill  . acetaminophen (TYLENOL) 500 MG tablet   Oral   Take 1,000 mg by mouth every 6 (six) hours as needed for pain.         Marland Kitchen ibuprofen (ADVIL,MOTRIN) 800 MG tablet   Oral   Take 800 mg by mouth every 8 (eight) hours as needed for pain.         . Multiple Vitamin (ONE-A-DAY MENS PO)   Oral   Take 1 tablet by mouth daily.         Marland Kitchen tetrahydrozoline 0.05 % ophthalmic solution   Both Eyes   Place 1 drop into both eyes 2 (two) times daily as needed (dry eyes).          BP 113/66  Pulse 66  Temp(Src) 97.8 F (36.6 C) (Oral)  Resp 16  SpO2 99% Physical Exam  ED Course  Procedures (including critical care time) INCISION AND DRAINAGE Performed by: Carlyle Dolly Consent: Verbal consent obtained. Risks and benefits: risks, benefits and  alternatives were discussed Type: abscess  Body area:L buttock   Anesthesia: local infiltration  Incision was made with a scalpel.  Local anesthetic: lidocaine 2% wo epinephrine  Anesthetic total: 6 ml  Complexity: complex Blunt dissection to break up loculations  Drainage: purulent  Drainage amount: large  Packing material: 1/4 in iodoform gauze  Patient tolerance: Patient tolerated the procedure well with no immediate complications.   The patient is advised to follow up with surgery. Return here as needed. Soak in warm bath with epsom salts.    Carlyle Dolly, PA-C 03/17/13 1800

## 2013-03-17 NOTE — ED Notes (Signed)
Pt ambulatory to exam room with steady gait.  

## 2013-03-17 NOTE — ED Notes (Signed)
Wound dressed with gauze and paper tape.

## 2013-03-17 NOTE — ED Notes (Signed)
Pt c/o boil on lt lower buttock.  States it has been there for about a week.

## 2013-05-22 ENCOUNTER — Emergency Department (HOSPITAL_COMMUNITY)
Admission: EM | Admit: 2013-05-22 | Discharge: 2013-05-22 | Disposition: A | Discharge status: Home / Self Care | Payer: Self-pay | Attending: Emergency Medicine | Admitting: Emergency Medicine

## 2013-05-22 ENCOUNTER — Encounter (HOSPITAL_COMMUNITY): Payer: Self-pay | Admitting: Emergency Medicine

## 2013-05-22 DIAGNOSIS — F17200 Nicotine dependence, unspecified, uncomplicated: Secondary | ICD-10-CM | POA: Insufficient documentation

## 2013-05-22 DIAGNOSIS — L0231 Cutaneous abscess of buttock: Secondary | ICD-10-CM | POA: Insufficient documentation

## 2013-05-22 MED ORDER — HYDROCODONE-ACETAMINOPHEN 5-325 MG PO TABS: 1.0000 | ORAL_TABLET | ORAL | Status: DC | PRN

## 2013-05-22 MED ORDER — SULFAMETHOXAZOLE-TRIMETHOPRIM 800-160 MG PO TABS: 2.0000 | ORAL_TABLET | Freq: Two times a day (BID) | ORAL | Status: DC

## 2013-05-22 NOTE — ED Notes (Signed)
PT reports abces drained and pain is relieved.

## 2013-05-22 NOTE — ED Notes (Signed)
Pt. Stated, i have an abscess on my buttocks

## 2013-05-22 NOTE — ED Provider Notes (Signed)
CSN: [id number]     Arrival date & time 05/22/13  1114 History   None   This chart was scribed for Trixie Dredge PA-C, a non-physician practitioner working with Ethelda Chick, MD by Lewanda Rife, ED Scribe. This patient was seen in room TR10C/TR10C and the patient's care was started at 12:54 PM     Chief Complaint  Patient presents with  . Abscess   (Consider location/radiation/quality/duration/timing/severity/associated sxs/prior Treatment) The history is provided by the patient. No language interpreter was used.   HPI Comments: Ronnie Hoover is a 27 y.o. male who presents to the Emergency Department with PMHx of recurrent skin infections complaining of abscess on buttocks onset 2 days. Describes pain as mild in severity, and improved since it drained in ED. Reports associated mild drainage while waiting in ED. Reports trying warm soaks with moderate relief of symptoms. Denies associated fever, chills, emesis, and nausea.  Has had these multiple times previously with hx I&D.   History reviewed. No pertinent past medical history. History reviewed. No pertinent past surgical history. No family history on file. History  Substance Use Topics  . Smoking status: Current Every Day Smoker -- 1.00 packs/day    Types: Cigarettes  . Smokeless tobacco: Not on file  . Alcohol Use: Yes     Comment: occasionally    Review of Systems  Constitutional: Negative for fever.  Gastrointestinal: Negative for nausea and vomiting.  Skin:       Abscess   Psychiatric/Behavioral: Negative for confusion.    Allergies  Banana  Home Medications   Current Outpatient Rx  Name  Route  Sig  Dispense  Refill  . Multiple Vitamin (ONE-A-DAY MENS PO)   Oral   Take 1 tablet by mouth daily.         Marland Kitchen tetrahydrozoline 0.05 % ophthalmic solution   Both Eyes   Place 1 drop into both eyes 2 (two) times daily as needed (dry eyes).          BP 109/63  Pulse 88  Temp(Src) 97.5 F (36.4 C) (Oral)   Resp 15  SpO2 96% Physical Exam  Nursing note and vitals reviewed. Constitutional: He appears well-developed and well-nourished. No distress.  HENT:  Head: Normocephalic and atraumatic.  Neck: Neck supple.  Pulmonary/Chest: Effort normal.  Neurological: He is alert.  Skin: He is not diaphoretic.  Open abscess over left medial buttock with copious purulent drainage and surrounding induration.  No overlying erythema, no tenderness.      ED Course  Procedures  COORDINATION OF CARE:  Nursing notes reviewed. Vital signs reviewed. Initial pt interview and examination performed.   12:58 PM-Discussed treatment plan with pt at bedside. Pt agrees with plan.   Treatment plan initiated:Medications - No data to display   Initial diagnostic testing ordered.    Labs Review Labs Reviewed - No data to display Imaging Review No results found.  EKG Interpretation   None       MDM   1. Left buttock abscess    Pt with abscess of left buttock without cellulitis.  Afebrile, nontoxic.  Abscess opened on its own in ED with large amount of drainage - pt feeling much better afterwards.  There is a large enough opening in the abscess that I believe it will continue to drain.  Small amount of surrounding induration.  I do not think it will be beneficial at this time to open the abscess any further at this time.  I have encouraged  continue warm soaks and will start antibiotics given the location of the abscess.  Pt is experienced in taking care of abscesses and will return for worsening symptoms.  Discussed  findings, treatment, and follow up  with patient.  Pt given return precautions.  Pt verbalizes understanding and agrees with plan.      I personally performed the services described in this documentation, which was scribed in my presence. The recorded information has been reviewed and is accurate.    Trixie Dredge, PA-C 05/22/13 1335

## 2013-05-22 NOTE — ED Provider Notes (Signed)
Medical screening examination/treatment/procedure(s) were performed by non-physician practitioner and as supervising physician I was immediately available for consultation/collaboration.  EKG Interpretation   None        Ethelda Chick, MD 05/22/13 1340

## 2014-01-25 ENCOUNTER — Emergency Department (HOSPITAL_COMMUNITY)
Admission: EM | Admit: 2014-01-25 | Discharge: 2014-01-25 | Disposition: A | Discharge status: Home / Self Care | Payer: Self-pay | Attending: Emergency Medicine | Admitting: Emergency Medicine

## 2014-01-25 ENCOUNTER — Emergency Department (HOSPITAL_COMMUNITY): Payer: Self-pay

## 2014-01-25 ENCOUNTER — Encounter (HOSPITAL_COMMUNITY): Payer: Self-pay | Admitting: Emergency Medicine

## 2014-01-25 DIAGNOSIS — X58XXXA Exposure to other specified factors, initial encounter: Secondary | ICD-10-CM | POA: Insufficient documentation

## 2014-01-25 DIAGNOSIS — S99929A Unspecified injury of unspecified foot, initial encounter: Secondary | ICD-10-CM

## 2014-01-25 DIAGNOSIS — Y929 Unspecified place or not applicable: Secondary | ICD-10-CM | POA: Insufficient documentation

## 2014-01-25 DIAGNOSIS — S8990XA Unspecified injury of unspecified lower leg, initial encounter: Secondary | ICD-10-CM | POA: Insufficient documentation

## 2014-01-25 DIAGNOSIS — S93409A Sprain of unspecified ligament of unspecified ankle, initial encounter: Secondary | ICD-10-CM | POA: Insufficient documentation

## 2014-01-25 DIAGNOSIS — S93402A Sprain of unspecified ligament of left ankle, initial encounter: Secondary | ICD-10-CM

## 2014-01-25 DIAGNOSIS — Z79899 Other long term (current) drug therapy: Secondary | ICD-10-CM | POA: Insufficient documentation

## 2014-01-25 DIAGNOSIS — F17200 Nicotine dependence, unspecified, uncomplicated: Secondary | ICD-10-CM | POA: Insufficient documentation

## 2014-01-25 DIAGNOSIS — Y939 Activity, unspecified: Secondary | ICD-10-CM | POA: Insufficient documentation

## 2014-01-25 DIAGNOSIS — S99919A Unspecified injury of unspecified ankle, initial encounter: Secondary | ICD-10-CM

## 2014-01-25 MED ORDER — IBUPROFEN 800 MG PO TABS: 800.0000 mg | ORAL_TABLET | Freq: Four times a day (QID) | ORAL | Status: DC | PRN

## 2014-01-25 NOTE — Discharge Instructions (Signed)
Ice and elevate the ankle. Follow up with the orthopedist provided as needed. Your x-rays were normal

## 2014-01-25 NOTE — ED Notes (Signed)
Pt reports left ankle pain. Denies fall or injury. Says pain increased at work on Sunday and is not any better today. Pt ambulatory.

## 2014-01-25 NOTE — ED Provider Notes (Signed)
CSN: [id number]     Arrival date & time 01/25/14  0851 History   First MD Initiated Contact with Patient 01/25/14 [id number]     No chief complaint on file.    (Consider location/radiation/quality/duration/timing/severity/associated sxs/prior Treatment) HPI Patient presents to the emergency department with left ankle pain that started last Friday, but worsened over the weekend.  The patient, states, that he does not recall a specific injury or trauma to the ankle.  Patient, states, that he was running around playing with his kids on Friday and thinks it may be because of the pain.  He also stands for long periods of time while working.  Patient, states, that he had swelling to the ankle intermittently over the last 3 days.  Patient denies numbness, weakness, back pain,fever, or rash.  The patient, states, that he did not take any medications prior to arrival.  Movement and palpation make the pain, worse History reviewed. No pertinent past medical history. History reviewed. No pertinent past surgical history. No family history on file. History  Substance Use Topics  . Smoking status: Current Every Day Smoker -- 1.00 packs/day    Types: Cigarettes  . Smokeless tobacco: Not on file  . Alcohol Use: Yes     Comment: occasionally    Review of Systems  All other systems negative except as documented in the HPI. All pertinent positives and negatives as reviewed in the HPI.  Allergies  Banana  Home Medications   Prior to Admission medications   Medication Sig Start Date End Date Taking? Authorizing Provider  Multiple Vitamin (ONE-A-DAY MENS PO) Take 1 tablet by mouth daily.   Yes Historical Provider, MD  Polyvinyl Alcohol-Povidone (CLEAR EYES ALL SEASONS OP) Apply 2 drops to eye once as needed.   Yes Historical Provider, MD   BP 114/82  Pulse 57  Temp(Src) 97.7 F (36.5 C) (Oral)  Resp 18  SpO2 100% Physical Exam  Nursing note and vitals reviewed. Constitutional: He is oriented to person,  place, and time. He appears well-developed and well-nourished.  HENT:  Head: Normocephalic and atraumatic.  Eyes: Pupils are equal, round, and reactive to light.  Pulmonary/Chest: Effort normal.  Musculoskeletal: He exhibits no edema.       Left ankle: He exhibits swelling. He exhibits normal range of motion, no ecchymosis, no deformity and normal pulse. Tenderness. Lateral malleolus and medial malleolus tenderness found. Achilles tendon normal.       Feet:  Neurological: He is alert and oriented to person, place, and time.  Skin: Skin is warm and dry. No rash noted. No erythema.    ED Course  Procedures (including critical care time) Labs Review Labs Reviewed - No data to display  Imaging Review Dg Ankle Complete Left  01/25/2014   CLINICAL DATA:  Left ankle pain.  EXAM: LEFT ANKLE COMPLETE - 3+ VIEW  COMPARISON:  None.  FINDINGS: There is no evidence of fracture, dislocation, or joint effusion. There is no evidence of arthropathy or other focal bone abnormality. Soft tissues are unremarkable.  IMPRESSION: Normal left ankle.   Electronically Signed   By: Roque Lias M.D.   On: 01/25/2014 09:35   Patient be placed on ASO, and advised to ice and elevate his ankle and told to return here as, needed.  We'll give orthopedic followup as needed    Carlyle Dolly, PA-C 01/25/14 1004

## 2014-01-25 NOTE — ED Provider Notes (Signed)
Medical screening examination/treatment/procedure(s) were performed by non-physician practitioner and as supervising physician I was immediately available for consultation/collaboration.   EKG Interpretation None       Ethelda Chick, MD 01/25/14 1005

## 2014-07-25 ENCOUNTER — Emergency Department (HOSPITAL_COMMUNITY)
Admission: EM | Admit: 2014-07-25 | Discharge: 2014-07-25 | Disposition: A | Discharge status: Home / Self Care | Payer: Self-pay | Attending: Emergency Medicine | Admitting: Emergency Medicine

## 2014-07-25 ENCOUNTER — Encounter (HOSPITAL_COMMUNITY): Payer: Self-pay | Admitting: *Deleted

## 2014-07-25 DIAGNOSIS — Z720 Tobacco use: Secondary | ICD-10-CM | POA: Insufficient documentation

## 2014-07-25 DIAGNOSIS — K088 Other specified disorders of teeth and supporting structures: Secondary | ICD-10-CM | POA: Insufficient documentation

## 2014-07-25 DIAGNOSIS — K029 Dental caries, unspecified: Secondary | ICD-10-CM | POA: Insufficient documentation

## 2014-07-25 DIAGNOSIS — K0889 Other specified disorders of teeth and supporting structures: Secondary | ICD-10-CM

## 2014-07-25 MED ORDER — IBUPROFEN 800 MG PO TABS: 800.0000 mg | ORAL_TABLET | Freq: Four times a day (QID) | ORAL | Status: DC | PRN

## 2014-07-25 MED ORDER — PENICILLIN V POTASSIUM 500 MG PO TABS: 500.0000 mg | ORAL_TABLET | Freq: Three times a day (TID) | ORAL | Status: DC

## 2014-07-25 NOTE — Discharge Instructions (Signed)

## 2014-07-25 NOTE — ED Notes (Signed)
[place], PA-C, at the bedside.  

## 2014-07-25 NOTE — ED Provider Notes (Signed)
CSN: [id number]     Arrival date & time 07/25/14  0038 History   First MD Initiated Contact with Patient 07/25/14 0100     Chief Complaint  Patient presents with  . Dental Pain     (Consider location/radiation/quality/duration/timing/severity/associated sxs/prior Treatment) HPI   29 year old male presents complaining of dental pain. Pt report since last night he has had persistent worsening throbbing sharp pain to his R upper tooth.  Pain worsening with chewing, cold air, cold water.  He has tried taking ibuprofen and orajel without relief.  No fever, sore throat, jaw pain, or other complaints.  Does not have a dentist.  Pt is a smoker.    History reviewed. No pertinent past medical history. History reviewed. No pertinent past surgical history. No family history on file. History  Substance Use Topics  . Smoking status: Current Every Day Smoker -- 1.00 packs/day    Types: Cigarettes  . Smokeless tobacco: Not on file  . Alcohol Use: Yes     Comment: occasionally    Review of Systems  Constitutional: Negative for fever.  HENT: Positive for dental problem.   Neurological: Negative for numbness.      Allergies  Banana  Home Medications   Prior to Admission medications   Medication Sig Start Date End Date Taking? Authorizing Provider  ibuprofen (ADVIL,MOTRIN) 800 MG tablet Take 1 tablet (800 mg total) by mouth every 6 (six) hours as needed for moderate pain. 01/25/14   Jamesetta Orleans Lawyer, PA-C  Multiple Vitamin (ONE-A-DAY MENS PO) Take 1 tablet by mouth daily.    Historical Provider, MD  Polyvinyl Alcohol-Povidone (CLEAR EYES ALL SEASONS OP) Apply 2 drops to eye once as needed.    Historical Provider, MD   BP 114/77 mmHg  Pulse 92  Temp(Src) 98.2 F (36.8 C) (Oral)  Resp 16  Ht 6' (1.829 m)  Wt 145 lb (65.772 kg)  BMI 19.66 kg/m2  SpO2 96% Physical Exam  Constitutional: He appears well-developed and well-nourished. No distress.  HENT:  Head: Atraumatic.  Mouth:  dental decay and tenderness to tooth #11.  No gingival erythema or abscess noted.  No trismus.    Eyes: Conjunctivae are normal.  Neck: Normal range of motion. Neck supple.  Neurological: He is alert.  Skin: No rash noted.  Psychiatric: He has a normal mood and affect.    ED Course  NERVE BLOCK Date/Time: 07/25/2014 1:21 AM Performed by: Fayrene Helper Authorized by: Fayrene Helper Consent: Verbal consent obtained. Risks and benefits: risks, benefits and alternatives were discussed Consent given by: patient Patient understanding: patient states understanding of the procedure being performed Patient consent: the patient's understanding of the procedure matches consent given Patient identity confirmed: verbally with patient and arm band Time out: Immediately prior to procedure a "time out" was called to verify the correct patient, procedure, equipment, support staff and site/side marked as required. Indications: pain relief Body area: face/mouth Nerve: middle superior alveolar Laterality: left Patient sedated: no Patient position: sitting Needle gauge: 27 G Location technique: anatomical landmarks Local anesthetic: topical anesthetic and bupivacaine 0.5% without epinephrine Anesthetic total: 1 ml Outcome: pain improved   (including critical care time)  1:20 AM Pt presents with dental pain, and agrees for a dental block to help aleviate pain.  Dental block performed by me with immediate relief of sxs.  Pt discharge with abx, NSAIDs, and dental referral.    Labs Review Labs Reviewed - No data to display  Imaging Review No results found.  EKG Interpretation None      MDM   Final diagnoses:  Pain, dental    BP 114/77 mmHg  Pulse 92  Temp(Src) 98.2 F (36.8 C) (Oral)  Resp 16  Ht 6' (1.829 m)  Wt 145 lb (65.772 kg)  BMI 19.66 kg/m2  SpO2 96%     Fayrene HelperBowie Ernesta Trabert, PA-C 07/25/14 0124  Dione Boozeavid Glick, MD 07/25/14 [phone]

## 2014-07-25 NOTE — ED Notes (Signed)
Toothache since yesterday 

## 2014-08-10 ENCOUNTER — Emergency Department (HOSPITAL_COMMUNITY)
Admission: EM | Admit: 2014-08-10 | Discharge: 2014-08-10 | Disposition: A | Discharge status: Home / Self Care | Payer: Self-pay | Attending: Emergency Medicine | Admitting: Emergency Medicine

## 2014-08-10 ENCOUNTER — Emergency Department (HOSPITAL_COMMUNITY): Payer: Self-pay

## 2014-08-10 ENCOUNTER — Encounter (HOSPITAL_COMMUNITY): Payer: Self-pay | Admitting: *Deleted

## 2014-08-10 DIAGNOSIS — M791 Myalgia: Secondary | ICD-10-CM | POA: Insufficient documentation

## 2014-08-10 DIAGNOSIS — M549 Dorsalgia, unspecified: Secondary | ICD-10-CM | POA: Insufficient documentation

## 2014-08-10 DIAGNOSIS — J189 Pneumonia, unspecified organism: Secondary | ICD-10-CM

## 2014-08-10 DIAGNOSIS — R51 Headache: Secondary | ICD-10-CM | POA: Insufficient documentation

## 2014-08-10 DIAGNOSIS — J159 Unspecified bacterial pneumonia: Secondary | ICD-10-CM | POA: Insufficient documentation

## 2014-08-10 DIAGNOSIS — Z792 Long term (current) use of antibiotics: Secondary | ICD-10-CM | POA: Insufficient documentation

## 2014-08-10 DIAGNOSIS — Z720 Tobacco use: Secondary | ICD-10-CM | POA: Insufficient documentation

## 2014-08-10 LAB — COMPREHENSIVE METABOLIC PANEL
ALBUMIN: 4 g/dL (ref 3.5–5.2)
ALK PHOS: 63 U/L (ref 39–117)
ALT: 27 U/L (ref 0–53)
ANION GAP: 12 (ref 5–15)
AST: 30 U/L (ref 0–37)
BILIRUBIN TOTAL: 1.8 mg/dL — AB (ref 0.3–1.2)
BUN: 11 mg/dL (ref 6–23)
CHLORIDE: 100 mmol/L (ref 96–112)
CO2: 22 mmol/L (ref 19–32)
Calcium: 9.8 mg/dL (ref 8.4–10.5)
Creatinine, Ser: 0.97 mg/dL (ref 0.50–1.35)
Glucose, Bld: 127 mg/dL — ABNORMAL HIGH (ref 70–99)
POTASSIUM: 3.8 mmol/L (ref 3.5–5.1)
SODIUM: 134 mmol/L — AB (ref 135–145)
TOTAL PROTEIN: 8.4 g/dL — AB (ref 6.0–8.3)

## 2014-08-10 LAB — CBC WITH DIFFERENTIAL/PLATELET
BASOS ABS: 0 10*3/uL (ref 0.0–0.1)
Basophils Relative: 0 % (ref 0–1)
EOS PCT: 0 % (ref 0–5)
Eosinophils Absolute: 0 10*3/uL (ref 0.0–0.7)
HCT: 43.9 % (ref 39.0–52.0)
Hemoglobin: 15.4 g/dL (ref 13.0–17.0)
LYMPHS PCT: 13 % (ref 12–46)
Lymphs Abs: 2.3 10*3/uL (ref 0.7–4.0)
MCH: 32.8 pg (ref 26.0–34.0)
MCHC: 35.1 g/dL (ref 30.0–36.0)
MCV: 93.6 fL (ref 78.0–100.0)
Monocytes Absolute: 2.5 10*3/uL — ABNORMAL HIGH (ref 0.1–1.0)
Monocytes Relative: 14 % — ABNORMAL HIGH (ref 3–12)
NEUTROS ABS: 13.1 10*3/uL — AB (ref 1.7–7.7)
Neutrophils Relative %: 73 % (ref 43–77)
PLATELETS: 339 10*3/uL (ref 150–400)
RBC: 4.69 MIL/uL (ref 4.22–5.81)
RDW: 12.7 % (ref 11.5–15.5)
WBC: 17.9 10*3/uL — AB (ref 4.0–10.5)

## 2014-08-10 MED ORDER — AZITHROMYCIN 250 MG PO TABS: 250.0000 mg | ORAL_TABLET | Freq: Every day | ORAL | Status: DC

## 2014-08-10 MED ORDER — ONDANSETRON 4 MG PO TBDP
4.0000 mg | ORAL_TABLET | Freq: Once | ORAL | Status: AC
  Administered 2014-08-10: 4 mg via ORAL
  Filled 2014-08-10: qty 1

## 2014-08-10 MED ORDER — AZITHROMYCIN 250 MG PO TABS
500.0000 mg | ORAL_TABLET | Freq: Once | ORAL | Status: AC
Start: 2014-08-10 — End: 2014-08-10
  Administered 2014-08-10: 500 mg via ORAL
  Filled 2014-08-10: qty 2

## 2014-08-10 MED ORDER — KETOROLAC TROMETHAMINE 30 MG/ML IJ SOLN
30.0000 mg | Freq: Once | INTRAMUSCULAR | Status: AC
  Administered 2014-08-10: 30 mg via INTRAMUSCULAR
  Filled 2014-08-10: qty 1

## 2014-08-10 MED ORDER — ONDANSETRON 4 MG PO TBDP: 4.0000 mg | ORAL_TABLET | Freq: Three times a day (TID) | ORAL | Status: DC | PRN

## 2014-08-10 NOTE — Discharge Instructions (Signed)
Please read and follow all provided instructions.  Your diagnoses today include:  1. Community acquired pneumonia    Tests performed today include:  Blood counts and electrolytes  Chest x-ray -- shows pneumonia  Vital signs. See below for your results today.   Medications prescribed:   Azithromycin - antibiotic for respiratory infection  You have been prescribed an antibiotic medicine: take the entire course of medicine even if you are feeling better. Stopping early can cause the antibiotic not to work.   Zofran (ondansetron) - for nausea and vomiting  Take any prescribed medications only as directed.  Home care instructions:  Follow any educational materials contained in this packet.  Take the complete course of antibiotics that you were prescribed.   BE VERY CAREFUL not to take multiple medicines containing Tylenol (also called acetaminophen). Doing so can lead to an overdose which can damage your liver and cause liver failure and possibly death.   Follow-up instructions: Please follow-up with your primary care provider in the next 3 days for further evaluation of your symptoms and to ensure resolution of your infection.   Return instructions:   Please return to the Emergency Department if you experience worsening symptoms.   Return immediately with worsening breathing, worsening shortness of breath, or if you feel it is taking you more effort to breathe.   Please return if you have any other emergent concerns.  Additional Information:  Your vital signs today were: BP 120/78 mmHg   Pulse 99   Temp(Src) 98.7 F (37.1 C) (Oral)   Resp 18   Ht 6' (1.829 m)   Wt 145 lb (65.772 kg)   BMI 19.66 kg/m2   SpO2 95% If your blood pressure (BP) was elevated above 135/85 this visit, please have this repeated by your doctor within one month. --------------

## 2014-08-10 NOTE — ED Notes (Signed)
Pt reports generalized body aches, n/v, weakness and cough since Monday. Pt reports having a tooth pulled about a week ago. Pt finished a full dose of penicillin, pt reports no relief from OTC medications.

## 2014-08-10 NOTE — ED Provider Notes (Signed)
CSN: [id number]     Arrival date & time 08/10/14  1907 History  This chart was scribed for non-physician practitioner, Renne Crigler, working with Mancel Bale, MD by Richarda Overlie, ED Scribe. This patient was seen in room TR10C/TR10C and the patient's care was started at 8:03 PM.    Chief Complaint  Patient presents with  . Cough  . Nausea  . Emesis  . Generalized Body Aches   The history is provided by the patient. No language interpreter was used.   HPI Comments: Ronnie Hoover is a 29 y.o. male who presents to the Emergency Department complaining of body aches that started a week ago. Pt states that he was seen a week ago for tooth pain, was prescribed penicillin and had the tooth extracted. He says that he finished his full rx of penicillin. Pt states shortly after that time he started to have a productive cough and congestion that has been improving. Pt states he then started experiencing nausea, vomiting, chills, subjective fever and body aches that started a few days ago. He says that he has been vomiting multiple times daily and states that the last time he vomited was PTA. Pt reports that his entire back hurts at this time. Pt states he has been taking theraflu which has not provided him any relief. He states he has tried to take pepto bismal which he was not able to keep down. Pt reports his sister had the flu recently. He denies any recent tick bites. He denies sore throat.   History reviewed. No pertinent past medical history. History reviewed. No pertinent past surgical history. History reviewed. No pertinent family history. History  Substance Use Topics  . Smoking status: Current Every Day Smoker -- 1.00 packs/day    Types: Cigarettes  . Smokeless tobacco: Not on file  . Alcohol Use: Yes     Comment: occasionally    Review of Systems  Constitutional: Positive for fever, chills and fatigue.  HENT: Positive for congestion. Negative for rhinorrhea and sore throat.    Eyes: Negative for redness.  Respiratory: Positive for cough and shortness of breath.   Cardiovascular: Negative for chest pain.  Gastrointestinal: Positive for nausea and vomiting. Negative for abdominal pain and diarrhea.  Genitourinary: Negative for dysuria.  Musculoskeletal: Positive for myalgias and back pain.  Skin: Negative for rash.  Neurological: Positive for headaches.    Allergies  Banana  Home Medications   Prior to Admission medications   Medication Sig Start Date End Date Taking? Authorizing Provider  ibuprofen (ADVIL,MOTRIN) 800 MG tablet Take 1 tablet (800 mg total) by mouth every 6 (six) hours as needed for moderate pain. 07/25/14   Fayrene Helper, PA-C  Multiple Vitamin (ONE-A-DAY MENS PO) Take 1 tablet by mouth daily.    Historical Provider, MD  penicillin v potassium (VEETID) 500 MG tablet Take 1 tablet (500 mg total) by mouth 3 (three) times daily. 07/25/14   Fayrene Helper, PA-C  Polyvinyl Alcohol-Povidone (CLEAR EYES ALL SEASONS OP) Apply 2 drops to eye once as needed.    Historical Provider, MD   BP 120/78 mmHg  Pulse 99  Temp(Src) 98.7 F (37.1 C) (Oral)  Resp 18  Ht 6' (1.829 m)  Wt 145 lb (65.772 kg)  BMI 19.66 kg/m2  SpO2 95%   Physical Exam  Constitutional: He is oriented to person, place, and time. He appears well-developed and well-nourished.  HENT:  Head: Normocephalic and atraumatic.  Right Ear: Tympanic membrane, external ear and ear canal  normal.  Left Ear: Tympanic membrane, external ear and ear canal normal.  Nose: Nose normal. No mucosal edema or rhinorrhea.  Mouth/Throat: Uvula is midline, oropharynx is clear and moist and mucous membranes are normal. Mucous membranes are not dry. No trismus in the jaw. No uvula swelling. No oropharyngeal exudate, posterior oropharyngeal edema, posterior oropharyngeal erythema or tonsillar abscesses.  Eyes: Conjunctivae are normal. Right eye exhibits no discharge. Left eye exhibits no discharge.  Neck: Normal  range of motion. Neck supple. No tracheal deviation present.  Cardiovascular: Normal rate, regular rhythm and normal heart sounds.   No murmur heard. Pulmonary/Chest: Effort normal and breath sounds normal. No respiratory distress. He has no wheezes. He has no rales.  Coughing during exam.  Abdominal: Soft. He exhibits no distension. There is no tenderness.  Lymphadenopathy:    He has no cervical adenopathy.  Neurological: He is alert and oriented to person, place, and time.  Skin: Skin is warm and dry.  Psychiatric: He has a normal mood and affect. His behavior is normal.  Nursing note and vitals reviewed.   ED Course  Procedures   DIAGNOSTIC STUDIES: Oxygen Saturation is 95% on RA, normal by my interpretation.    COORDINATION OF CARE: 8:10 PM Discussed treatment plan with pt at bedside and pt agreed to plan. We'll give Toradol for muscle aches and Zofran for nausea. Will give by mouth challenge.   Labs Review Labs Reviewed  COMPREHENSIVE METABOLIC PANEL - Abnormal; Notable for the following:    Sodium 134 (*)    Glucose, Bld 127 (*)    Total Protein 8.4 (*)    Total Bilirubin 1.8 (*)    All other components within normal limits  CBC WITH DIFFERENTIAL/PLATELET - Abnormal; Notable for the following:    WBC 17.9 (*)    Neutro Abs 13.1 (*)    Monocytes Relative 14 (*)    Monocytes Absolute 2.5 (*)    All other components within normal limits    Imaging Review Dg Chest 2 View  08/10/2014   CLINICAL DATA:  Acute onset of productive cough, nausea, vomiting and generalized body aches. Fever. Initial encounter.  EXAM: CHEST  2 VIEW  COMPARISON:  Chest radiograph performed 02/11/2013  FINDINGS: The lungs are well-aerated. Focal left basilar airspace opacity is compatible with pneumonia. There is no evidence of pleural effusion or pneumothorax.  The heart is normal in size; the mediastinal contour is within normal limits. No acute osseous abnormalities are seen.  IMPRESSION: Focal  left basilar pneumonia noted.   Electronically Signed   By: Roanna RaiderJeffery  Chang M.D.   On: 08/10/2014 21:04     EKG Interpretation None         Vital signs reviewed and are as follows: Filed Vitals:   08/10/14 1918  BP: 120/78  Pulse: 99  Temp: 98.7 F (37.1 C)  Resp: 18   X-ray reviewed by myself. Patient with left basilar pneumonia.  Patient given azithromycin.  Patient has not vomited while in emergency department. He is stable for discharge home at this time.    MDM   Final diagnoses:  Community acquired pneumonia   Patient with presentation consistent with influenza versus pneumonia. Chest x-ray demonstrates focal left lower lobe pneumonia. Labs ordered per triage as noted. Abnormalities consistent with pneumonia. Patient is now tolerating oral fluids and his medications. Will discharge to home. No indications for admission at this time. No history of immunocompromise. No hypoxia.   I personally performed the services described in  this documentation, which was scribed in my presence. The recorded information has been reviewed and is accurate.       Renne Crigler, PA-C 08/10/14 2207  Mancel Bale, MD 08/11/14 [phone]

## 2015-06-17 ENCOUNTER — Emergency Department (HOSPITAL_COMMUNITY): Payer: Self-pay

## 2015-06-17 ENCOUNTER — Emergency Department (HOSPITAL_COMMUNITY)
Admission: EM | Admit: 2015-06-17 | Discharge: 2015-06-17 | Disposition: A | Discharge status: Home / Self Care | Payer: Self-pay | Attending: Emergency Medicine | Admitting: Emergency Medicine

## 2015-06-17 ENCOUNTER — Encounter (HOSPITAL_COMMUNITY): Payer: Self-pay | Admitting: *Deleted

## 2015-06-17 DIAGNOSIS — J069 Acute upper respiratory infection, unspecified: Secondary | ICD-10-CM | POA: Insufficient documentation

## 2015-06-17 DIAGNOSIS — M791 Myalgia: Secondary | ICD-10-CM | POA: Insufficient documentation

## 2015-06-17 DIAGNOSIS — R112 Nausea with vomiting, unspecified: Secondary | ICD-10-CM | POA: Insufficient documentation

## 2015-06-17 DIAGNOSIS — F17210 Nicotine dependence, cigarettes, uncomplicated: Secondary | ICD-10-CM | POA: Insufficient documentation

## 2015-06-17 DIAGNOSIS — Z79899 Other long term (current) drug therapy: Secondary | ICD-10-CM | POA: Insufficient documentation

## 2015-06-17 MED ORDER — BENZONATATE 100 MG PO CAPS
100.0000 mg | ORAL_CAPSULE | Freq: Once | ORAL | Status: AC
  Administered 2015-06-17: 100 mg via ORAL
  Filled 2015-06-17: qty 1

## 2015-06-17 MED ORDER — ONDANSETRON HCL 4 MG PO TABS
4.0000 mg | ORAL_TABLET | Freq: Once | ORAL | Status: AC
  Administered 2015-06-17: 4 mg via ORAL
  Filled 2015-06-17: qty 1

## 2015-06-17 MED ORDER — ONDANSETRON 4 MG PO TBDP: ORAL_TABLET | ORAL | Status: DC

## 2015-06-17 MED ORDER — IBUPROFEN 800 MG PO TABS
800.0000 mg | ORAL_TABLET | Freq: Once | ORAL | Status: AC
  Administered 2015-06-17: 800 mg via ORAL
  Filled 2015-06-17: qty 1

## 2015-06-17 MED ORDER — BENZONATATE 100 MG PO CAPS: 100.0000 mg | ORAL_CAPSULE | Freq: Three times a day (TID) | ORAL | Status: DC

## 2015-06-17 MED ORDER — ACETAMINOPHEN 500 MG PO TABS
1000.0000 mg | ORAL_TABLET | Freq: Once | ORAL | Status: AC
  Administered 2015-06-17: 1000 mg via ORAL
  Filled 2015-06-17: qty 2

## 2015-06-17 NOTE — Discharge Instructions (Signed)
Take 4 over the counter ibuprofen tablets 3 times a day or 2 over-the-counter naproxen tablets twice a day for pain. ° °Upper Respiratory Infection, Adult °Most upper respiratory infections (URIs) are a viral infection of the air passages leading to the lungs. A URI affects the nose, throat, and upper air passages. The most common type of URI is nasopharyngitis and is typically referred to as "the common cold." °URIs run their course and usually go away on their own. Most of the time, a URI does not require medical attention, but sometimes a bacterial infection in the upper airways can follow a viral infection. This is called a secondary infection. Sinus and middle ear infections are common types of secondary upper respiratory infections. °Bacterial pneumonia can also complicate a URI. A URI can worsen asthma and chronic obstructive pulmonary disease (COPD). Sometimes, these complications can require emergency medical care and may be life threatening.  °CAUSES °Almost all URIs are caused by viruses. A virus is a type of germ and can spread from one person to another.  °RISKS FACTORS °You may be at risk for a URI if:  °· You smoke.   °· You have chronic heart or lung disease. °· You have a weakened defense (immune) system.   °· You are very young or very old.   °· You have nasal allergies or asthma. °· You work in crowded or poorly ventilated areas. °· You work in health care facilities or schools. °SIGNS AND SYMPTOMS  °Symptoms typically develop 2-3 days after you come in contact with a cold virus. Most viral URIs last 7-10 days. However, viral URIs from the influenza virus (flu virus) can last 14-18 days and are typically more severe. Symptoms may include:  °· Runny or stuffy (congested) nose.   °· Sneezing.   °· Cough.   °· Sore throat.   °· Headache.   °· Fatigue.   °· Fever.   °· Loss of appetite.   °· Pain in your forehead, behind your eyes, and over your cheekbones (sinus pain). °· Muscle aches.   °DIAGNOSIS    °Your health care provider may diagnose a URI by: °· Physical exam. °· Tests to check that your symptoms are not due to another condition such as: °¨ Strep throat. °¨ Sinusitis. °¨ Pneumonia. °¨ Asthma. °TREATMENT  °A URI goes away on its own with time. It cannot be cured with medicines, but medicines may be prescribed or recommended to relieve symptoms. Medicines may help: °· Reduce your fever. °· Reduce your cough. °· Relieve nasal congestion. °HOME CARE INSTRUCTIONS  °· Take medicines only as directed by your health care provider.   °· Gargle warm saltwater or take cough drops to comfort your throat as directed by your health care provider. °· Use a warm mist humidifier or inhale steam from a shower to increase air moisture. This may make it easier to breathe. °· Drink enough fluid to keep your urine clear or pale yellow.   °· Eat soups and other clear broths and maintain good nutrition.   °· Rest as needed.   °· Return to work when your temperature has returned to normal or as your health care provider advises. You may need to stay home longer to avoid infecting others. You can also use a face mask and careful hand washing to prevent spread of the virus. °· Increase the usage of your inhaler if you have asthma.   °· Do not use any tobacco products, including cigarettes, chewing tobacco, or electronic cigarettes. If you need help quitting, ask your health care provider. °PREVENTION  °The best way   to protect yourself from getting a cold is to practice good hygiene.  °· Avoid oral or hand contact with people with cold symptoms.   °· Wash your hands often if contact occurs.   °There is no clear evidence that vitamin C, vitamin E, echinacea, or exercise reduces the chance of developing a cold. However, it is always recommended to get plenty of rest, exercise, and practice good nutrition.  °SEEK MEDICAL CARE IF:  °· You are getting worse rather than better.   °· Your symptoms are not controlled by medicine.   °· You  have chills. °· You have worsening shortness of breath. °· You have brown or red mucus. °· You have yellow or brown nasal discharge. °· You have pain in your face, especially when you bend forward. °· You have a fever. °· You have swollen neck glands. °· You have pain while swallowing. °· You have white areas in the back of your throat. °SEEK IMMEDIATE MEDICAL CARE IF:  °· You have severe or persistent: °¨ Headache. °¨ Ear pain. °¨ Sinus pain. °¨ Chest pain. °· You have chronic lung disease and any of the following: °¨ Wheezing. °¨ Prolonged cough. °¨ Coughing up blood. °¨ A change in your usual mucus. °· You have a stiff neck. °· You have changes in your: °¨ Vision. °¨ Hearing. °¨ Thinking. °¨ Mood. °MAKE SURE YOU:  °· Understand these instructions. °· Will watch your condition. °· Will get help right away if you are not doing well or get worse. °  °This information is not intended to replace advice given to you by your health care provider. Make sure you discuss any questions you have with your health care provider. °  °Document Released: 11/06/2000 Document Revised: 09/27/2014 Document Reviewed: 08/18/2013 °[employer]vier Interactive Patient Education ©2016 [employer] ° °

## 2015-06-17 NOTE — ED Notes (Signed)
Patient transported to X-ray 

## 2015-06-17 NOTE — ED Notes (Signed)
Awake. Verbally responsive. A/O x4. Resp even and unlabored. No audible adventitious breath sounds noted. ABC's intact.  

## 2015-06-17 NOTE — ED Notes (Signed)
Pt reported productive cough of yellowish sputum, generalized chest pain, SHOB, body aches, diarrhea x 4, denies nausea/diaphoresis. Denies fever.

## 2015-06-17 NOTE — ED Provider Notes (Signed)
CSN: [id number]     Arrival date & time 06/17/15  1228 History   First MD Initiated Contact with Patient 06/17/15 1302     Chief Complaint  Patient presents with  . Shortness of Breath  . Emesis     (Consider location/radiation/quality/duration/timing/severity/associated sxs/prior Treatment) Patient is a 30 y.o. male presenting with shortness of breath and vomiting.  Shortness of Breath Severity:  Mild Onset quality:  Gradual Duration:  2 days Timing:  Constant Progression:  Worsening Chronicity:  New Relieved by:  Nothing Worsened by:  Nothing tried Ineffective treatments:  None tried Associated symptoms: cough and vomiting   Associated symptoms: no abdominal pain, no chest pain, no fever, no headaches and no rash   Emesis Associated symptoms: myalgias   Associated symptoms: no abdominal pain, no arthralgias, no chills, no diarrhea and no headaches     30 yo M with a chief complaint of cough and myalgias. This been going on for the past couple days. Is been exposed to similar illness. Patient denies any abdominal pain. Has had some nausea and vomiting.  No past medical history on file. History reviewed. No pertinent past surgical history. No family history on file. Social History  Substance Use Topics  . Smoking status: Current Every Day Smoker -- 1.00 packs/day    Types: Cigarettes  . Smokeless tobacco: None  . Alcohol Use: Yes     Comment: occasionally    Review of Systems  Constitutional: Negative for fever and chills.  HENT: Positive for congestion. Negative for facial swelling.   Eyes: Negative for discharge and visual disturbance.  Respiratory: Positive for cough. Negative for shortness of breath.   Cardiovascular: Negative for chest pain and palpitations.  Gastrointestinal: Positive for nausea and vomiting. Negative for abdominal pain and diarrhea.  Musculoskeletal: Positive for myalgias. Negative for arthralgias.  Skin: Negative for color change and rash.   Neurological: Negative for tremors, syncope and headaches.  Psychiatric/Behavioral: Negative for confusion and dysphoric mood.      Allergies  Banana and Tomato  Home Medications   Prior to Admission medications   Medication Sig Start Date End Date Taking? Authorizing Provider  DM-Phenylephrine-Acetaminophen (VICKS DAYQUIL COLD & FLU) 10-5-325 MG CAPS Take 2 capsules by mouth once.   Yes Historical Provider, MD  Multiple Vitamin (MULTIVITAMIN WITH MINERALS) TABS tablet Take 1 tablet by mouth daily.   Yes Historical Provider, MD  Pseudoeph-CPM-DM-APAP (TYLENOL COLD) 30-2-15-325 MG TABS Take 2 tablets by mouth once.   Yes Historical Provider, MD  benzonatate (TESSALON) 100 MG capsule Take 1 capsule (100 mg total) by mouth every 8 (eight) hours. 06/17/15   Melene Plan, DO  ondansetron (ZOFRAN ODT) 4 MG disintegrating tablet  ODT q4 hours prn nausea/vomit 06/17/15   Melene Plan, DO   BP 136/95 mmHg  Pulse 61  Temp(Src) 98.7 F (37.1 C) (Oral)  Resp 16  Ht 6' (1.829 m)  Wt 145 lb (65.772 kg)  BMI 19.66 kg/m2  SpO2 94% Physical Exam  Constitutional: He is oriented to person, place, and time. He appears well-developed and well-nourished.  HENT:  Head: Normocephalic and atraumatic.  Swollen turbinates  Eyes: EOM are normal. Pupils are equal, round, and reactive to light.  Neck: Normal range of motion. Neck supple. No JVD present.  Cardiovascular: Normal rate and regular rhythm.  Exam reveals no gallop and no friction rub.   No murmur heard. Pulmonary/Chest: No respiratory distress. He has no wheezes.  Abdominal: He exhibits no distension. There is no tenderness.  There is no rebound and no guarding.  Musculoskeletal: Normal range of motion.  Neurological: He is alert and oriented to person, place, and time.  Skin: No rash noted. No pallor.  Psychiatric: He has a normal mood and affect. His behavior is normal.  Nursing note and vitals reviewed.   ED Course  Procedures (including  critical care time) Labs Review Labs Reviewed - No data to display  Imaging Review Dg Chest 2 View  06/17/2015  CLINICAL DATA:  Shortness of breath.  Left-sided chest pain. EXAM: CHEST  2 VIEW COMPARISON:  08/10/2014 FINDINGS: Cardiomediastinal silhouette is normal. Mediastinal contours appear intact. There is no evidence of focal airspace consolidation, pleural effusion or pneumothorax. Osseous structures are without acute abnormality. Soft tissues are grossly normal. IMPRESSION: No active cardiopulmonary disease. Electronically Signed   By: Ted Mcalpine M.D.   On: 06/17/2015 13:54   I have personally reviewed and evaluated these images and lab results as part of my medical decision-making.   EKG Interpretation None      MDM   Final diagnoses:  URI (upper respiratory infection)    30 yo M with a chief complaint of a URI. Patient symptoms are influenza-like. Will obtain a chest x-ray to rule out pneumonia. Able to tolerate PO in the ED.   3:15 PM:  I have discussed the diagnosis/risks/treatment options with the patient and family and believe the pt to be eligible for discharge home to follow-up with PCP. We also discussed returning to the ED immediately if new or worsening sx occur. We discussed the sx which are most concerning (e.g., sudden worsening pain, fever, inability to tolerate by mouth) that necessitate immediate return. Medications administered to the patient during their visit and any new prescriptions provided to the patient are listed below.  Medications given during this visit Medications  acetaminophen (TYLENOL) tablet 1,000 mg (1,000 mg Oral Given 06/17/15 1320)  ibuprofen (ADVIL,MOTRIN) tablet 800 mg (800 mg Oral Given 06/17/15 1319)  benzonatate (TESSALON) capsule 100 mg (100 mg Oral Given 06/17/15 1319)  ondansetron (ZOFRAN) tablet 4 mg (4 mg Oral Given 06/17/15 1405)    Discharge Medication List as of 06/17/2015  2:08 PM    START taking these medications    Details  benzonatate (TESSALON) 100 MG capsule Take 1 capsule (100 mg total) by mouth every 8 (eight) hours., Starting 06/17/2015, Until Discontinued, Print    ondansetron (ZOFRAN ODT) 4 MG disintegrating tablet  ODT q4 hours prn nausea/vomit, Print        The patient appears reasonably screen and/or stabilized for discharge and I doubt any other medical condition or other [facility] requiring further screening, evaluation, or treatment in the ED at this time prior to discharge.      Melene Plan, DO 06/17/15 1515

## 2015-06-17 NOTE — ED Notes (Signed)
Per pt report: pt has been feeling short of breath x 1 day. Pt reports generalized body aches and the inability to keep food/fluids down.  Pt reports taking cold medication without any relief. Pt reports coughing up yellow mucus.

## 2015-07-06 IMAGING — CR DG CHEST 2V
2 series · 2 of 2 positions shown · non-contrast
Comparison: 08/22/2007

CLINICAL DATA: Fever, sore throat, chest pain

CHEST - 2 VIEW

[w chest pa]
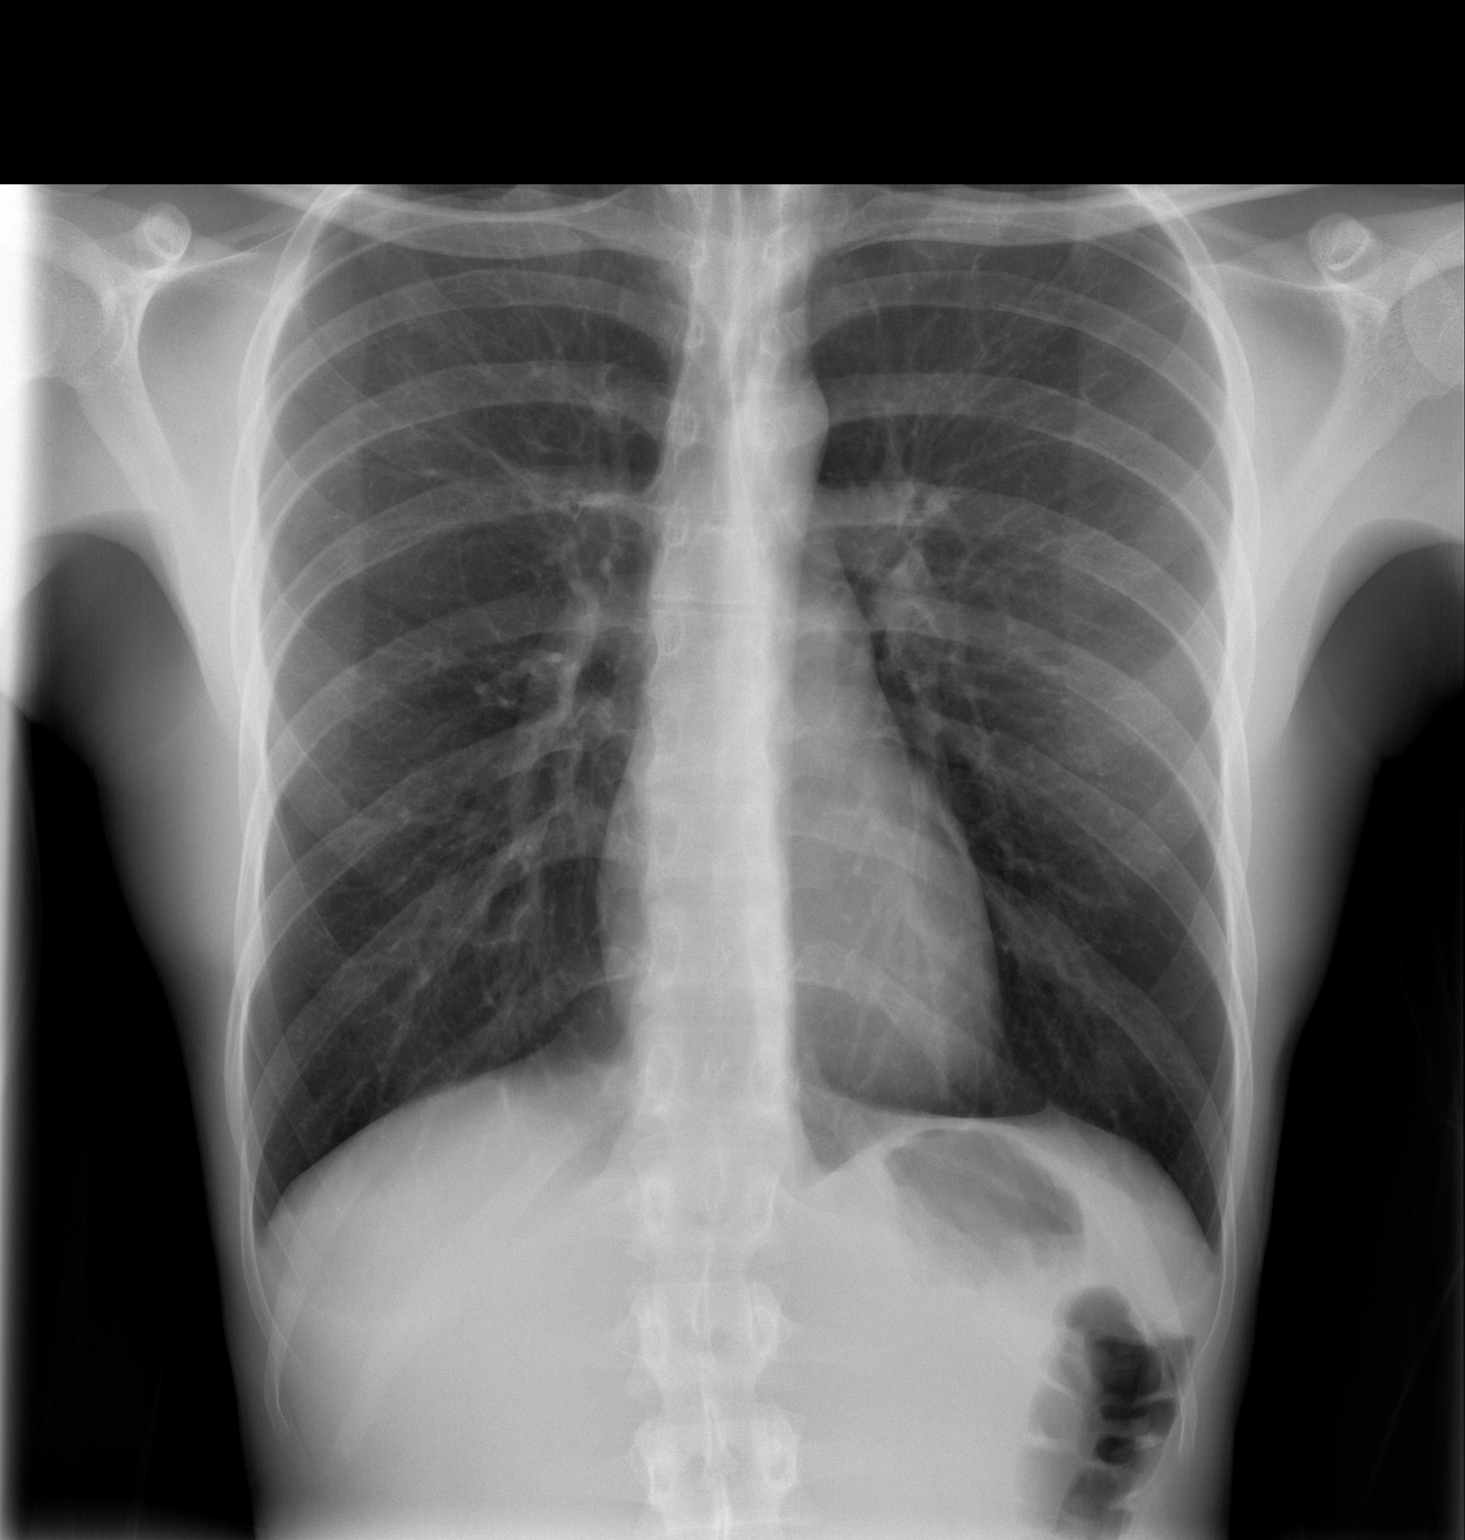

[w chest lat]
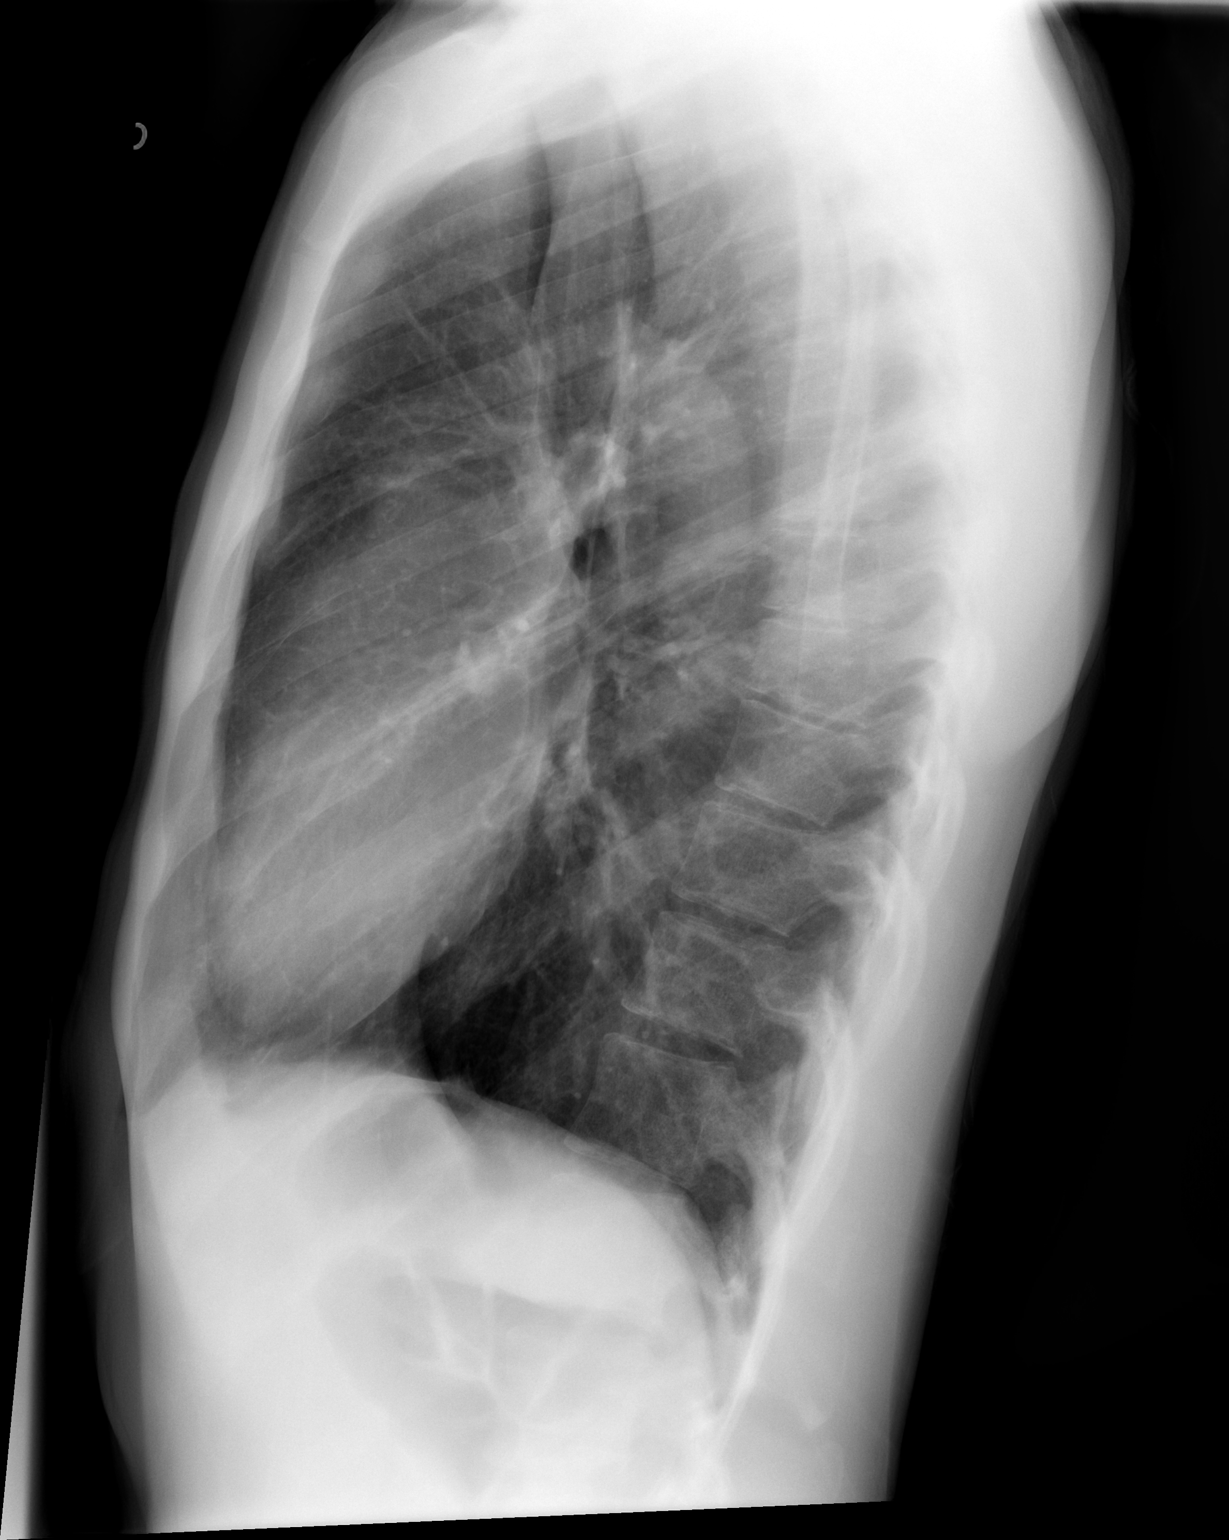

[2 of 2 positions shown; findings below may reference images not displayed]

FINDINGS: Cardiomediastinal silhouette is within normal limits. The
lungs are clear. No pleural effusion.  No pneumothorax.  No acute
osseous abnormality.
IMPRESSION: Normal chest.

## 2015-12-27 ENCOUNTER — Emergency Department (HOSPITAL_COMMUNITY): Payer: Self-pay

## 2015-12-27 ENCOUNTER — Emergency Department (HOSPITAL_COMMUNITY)
Admission: EM | Admit: 2015-12-27 | Discharge: 2015-12-27 | Disposition: A | Discharge status: Home / Self Care | Payer: Self-pay | Attending: Emergency Medicine | Admitting: Emergency Medicine

## 2015-12-27 ENCOUNTER — Encounter (HOSPITAL_COMMUNITY): Payer: Self-pay

## 2015-12-27 DIAGNOSIS — R0789 Other chest pain: Secondary | ICD-10-CM | POA: Insufficient documentation

## 2015-12-27 DIAGNOSIS — F17210 Nicotine dependence, cigarettes, uncomplicated: Secondary | ICD-10-CM | POA: Insufficient documentation

## 2015-12-27 LAB — BASIC METABOLIC PANEL
Anion gap: 6 (ref 5–15)
BUN: 6 mg/dL (ref 6–20)
CALCIUM: 9.5 mg/dL (ref 8.9–10.3)
CHLORIDE: 106 mmol/L (ref 101–111)
CO2: 26 mmol/L (ref 22–32)
CREATININE: 1.06 mg/dL (ref 0.61–1.24)
GFR calc non Af Amer: >60 mL/min (ref >60)
Glucose, Bld: 98 mg/dL (ref 65–99)
Potassium: 4.1 mmol/L (ref 3.5–5.1)
SODIUM: 138 mmol/L (ref 135–145)

## 2015-12-27 LAB — CBC
HCT: 46.1 % (ref 39.0–52.0)
Hemoglobin: 15.4 g/dL (ref 13.0–17.0)
MCH: 32.8 pg (ref 26.0–34.0)
MCHC: 33.4 g/dL (ref 30.0–36.0)
MCV: 98.3 fL (ref 78.0–100.0)
PLATELETS: 235 10*3/uL (ref 150–400)
RBC: 4.69 MIL/uL (ref 4.22–5.81)
RDW: 12.9 % (ref 11.5–15.5)
WBC: 9 10*3/uL (ref 4.0–10.5)

## 2015-12-27 LAB — I-STAT TROPONIN, ED: TROPONIN I, POC: 0 ng/mL (ref 0.00–0.08)

## 2015-12-27 MED ORDER — IBUPROFEN 600 MG PO TABS: 600.0000 mg | ORAL_TABLET | Freq: Three times a day (TID) | ORAL | 0 refills | Status: DC

## 2015-12-27 NOTE — ED Triage Notes (Signed)
Pt reports chest pain since Saturday. He reports the pain increases with deep breath. No distress noted, no cardiac hx.

## 2015-12-27 NOTE — ED Provider Notes (Signed)
[facility]-EMERGENCY DEPT Provider Note   CSN: [id number] Arrival date & time: 12/27/15  4098  First Provider Contact:  None       History   Chief Complaint Chief Complaint  Patient presents with  . Chest Pain    HPI Ronnie Hoover is a 30 y.o. male with no significant PMH who presents with chest pain for the past 4 days. He states he was wrestling with his dad to give him his "birthday licks". He had an acute onset of right sided chest pain the following day. He states it is constant, worse with movement, leaning forward, and breathing. Does not radiate. He has tried Ibuprofen and Tylenol without relief. Denies hx of HTN, HLD, cardiac hx, family hx of cardiac disease. He is a current smoker. Denies fever, chills, palpitations, leg swelling, SOB, cough, abdominal pain, N/V/D. He wanted to come in today to make sure nothing was broken.   HPI  History reviewed. No pertinent past medical history.  There are no active problems to display for this patient.   History reviewed. No pertinent surgical history.     Home Medications    Prior to Admission medications   Medication Sig Start Date End Date Taking? Authorizing Provider  acetaminophen (TYLENOL) 500 MG tablet Take 500 mg by mouth every 6 (six) hours as needed for mild pain.   Yes Historical Provider, MD  Multiple Vitamin (MULTIVITAMIN WITH MINERALS) TABS tablet Take 1 tablet by mouth daily.   Yes Historical Provider, MD  naproxen sodium (ALEVE) 220 MG tablet Take 220 mg by mouth 2 (two) times daily with a meal.   Yes Historical Provider, MD  ibuprofen (ADVIL,MOTRIN) 600 MG tablet Take 1 tablet (600 mg total) by mouth 3 (three) times daily. 12/27/15   Bethel Born, PA-C    Family History History reviewed. No pertinent family history.  Social History Social History  Substance Use Topics  . Smoking status: Current Every Day Smoker    Packs/day: 1.00    Types: Cigarettes  . Smokeless tobacco: Never Used  . Alcohol use Yes      Comment: occasionally     Allergies   Banana and Tomato   Review of Systems Review of Systems  Constitutional: Negative for chills and fever.  Respiratory: Negative for cough and shortness of breath.   Cardiovascular: Positive for chest pain. Negative for palpitations and leg swelling.  Gastrointestinal: Negative for abdominal pain, nausea and vomiting.  All other systems reviewed and are negative.    Physical Exam Updated Vital Signs BP 107/66   Pulse 60   Temp 98 F (36.7 C) (Oral)   Resp 16   Ht  (1.854 m)   Wt 65.8 kg   SpO2 98%   BMI 19.13 kg/m   Physical Exam  Constitutional: He is oriented to person, place, and time. He appears well-developed and well-nourished. No distress.  HENT:  Head: Normocephalic and atraumatic.  Eyes: Conjunctivae are normal. Pupils are equal, round, and reactive to light. Right eye exhibits no discharge. Left eye exhibits no discharge. No scleral icterus.  Neck: Normal range of motion.  Cardiovascular: Normal rate, regular rhythm and intact distal pulses.  Exam reveals no gallop and no friction rub.   No murmur heard. Pulmonary/Chest: Effort normal and breath sounds normal. No respiratory distress. He has no wheezes. He has no rales. He exhibits tenderness.  Reproducible right sided chest tenderness  Abdominal: Soft. Bowel sounds are normal. He exhibits no distension. There is no  tenderness.  Neurological: He is alert and oriented to person, place, and time.  Skin: Skin is warm and dry.  Psychiatric: He has a normal mood and affect.     ED Treatments / Results  Labs (all labs ordered are listed, but only abnormal results are displayed) Labs Reviewed  BASIC METABOLIC PANEL  CBC  I-STAT TROPOININ, ED    EKG  EKG Interpretation  Date/Time:  Wednesday December 27 2015 09:31:11 EDT Ventricular Rate:  70 PR Interval:  132 QRS Duration: 86 QT Interval:  382 QTC Calculation: 412 R Axis:   95 Text Interpretation:   Normal sinus rhythm Rightward axis ST elevation, consider early repolarization, pericarditis, or injury No significant change since last tracing Confirmed by Anitra Lauth  MD, Alphonzo Lemmings ([id number]) on 12/27/2015 11:11:59 AM       Radiology Dg Chest 2 View  Result Date: 12/27/2015 CLINICAL DATA:  Right anterior to lower lateral chest pain beginning 5 days ago and worse over the past 2 days. Onset of pain after wrestling 5 days ago. No specific injury. EXAM: CHEST  2 VIEW COMPARISON:  06/17/2015 FINDINGS: Lungs are hyperexpanded without consolidation, effusion or pneumothorax. Several EKG leads project over the mid to upper lungs. Cardiomediastinal silhouette is within normal. Subtle anterior wedging of a mid thoracic vertebral body unchanged. No evidence of rib fracture IMPRESSION: No acute findings. Electronically Signed   By: Elberta Fortis M.D.   On: 12/27/2015 10:29    Procedures Procedures (including critical care time)  Medications Ordered in ED Medications - No data to display   Initial Impression / Assessment and Plan / ED Course  I have reviewed the triage vital signs and the nursing notes.  Pertinent labs & imaging results that were available during my care of the patient were reviewed by me and considered in my medical decision making (see chart for details).  Clinical Course   30 year old male presents with chest wall pain. Chest pain work up is reassuring. EKG is NSR and shows no significant change since last. CXR is negative. Troponin is 0. Labs are unremarkable. No significant past or family hx of cardiac disease. Patient does smoker. HEART score is 1. Discussed results with patient. Will treat with NSAIDs, ice/heat. Patient is NAD, non-toxic, with stable VS. Patient is informed of clinical course, understands medical decision making process, and agrees with plan. Opportunity for questions provided and all questions answered. Return precautions given.   Final Clinical Impressions(s) / ED  Diagnoses   Final diagnoses:  Chest wall pain    New Prescriptions New Prescriptions   IBUPROFEN (ADVIL,MOTRIN) 600 MG TABLET    Take 1 tablet (600 mg total) by mouth 3 (three) times daily.     Bethel Born, PA-C 12/27/15 1544    Gwyneth Sprout, MD 12/28/15 1028

## 2015-12-29 ENCOUNTER — Encounter (HOSPITAL_COMMUNITY): Payer: Self-pay | Admitting: Family Medicine

## 2015-12-29 ENCOUNTER — Emergency Department (HOSPITAL_COMMUNITY): Payer: Self-pay

## 2015-12-29 ENCOUNTER — Emergency Department (HOSPITAL_COMMUNITY)
Admission: EM | Admit: 2015-12-29 | Discharge: 2015-12-29 | Disposition: A | Discharge status: Home / Self Care | Payer: Self-pay | Attending: Emergency Medicine | Admitting: Emergency Medicine

## 2015-12-29 DIAGNOSIS — Y929 Unspecified place or not applicable: Secondary | ICD-10-CM | POA: Insufficient documentation

## 2015-12-29 DIAGNOSIS — Y999 Unspecified external cause status: Secondary | ICD-10-CM | POA: Insufficient documentation

## 2015-12-29 DIAGNOSIS — S20211A Contusion of right front wall of thorax, initial encounter: Secondary | ICD-10-CM | POA: Insufficient documentation

## 2015-12-29 DIAGNOSIS — R0781 Pleurodynia: Secondary | ICD-10-CM

## 2015-12-29 DIAGNOSIS — W500XXA Accidental hit or strike by another person, initial encounter: Secondary | ICD-10-CM | POA: Insufficient documentation

## 2015-12-29 DIAGNOSIS — F17210 Nicotine dependence, cigarettes, uncomplicated: Secondary | ICD-10-CM | POA: Insufficient documentation

## 2015-12-29 DIAGNOSIS — Y939 Activity, unspecified: Secondary | ICD-10-CM | POA: Insufficient documentation

## 2015-12-29 MED ORDER — LIDOCAINE 5 % EX PTCH: 1.0000 | MEDICATED_PATCH | CUTANEOUS | 0 refills | Status: DC

## 2015-12-29 MED ORDER — NAPROXEN 500 MG PO TABS: 500.0000 mg | ORAL_TABLET | Freq: Two times a day (BID) | ORAL | 0 refills | Status: DC

## 2015-12-29 NOTE — Discharge Instructions (Signed)
Return to the emergency department if you have the following: You have sever shortness of breath. You develop a continual cough, or you cough up thick or bloody sputum. You feel sick to your stomach (nauseous), you throw up (vomit), or you have abdominal pain.   Your ribs are bruised.  There are no breaks. Use the medication I have prescribed for pain.  Apply ice to your ribs.  Return for the reasons listed above.

## 2015-12-29 NOTE — ED Triage Notes (Signed)
Pt here for continued right rib pain. sts he was seen Wednesday and told to come back if the pain is worse. Denies any SOB. Pt speaking in full sentences. sts he went to work Wednesday and worse since then.

## 2015-12-29 NOTE — ED Provider Notes (Signed)
[facility]-EMERG[facility]Y DEPT Provider Note   CSN: [id number] Arrival date & time: 12/29/15  1234  First Provider Contact:  None    By signing my name below, I, Essence Howell, attest that this documentation has been prepared under the direction and in the presence of Arthor Captain, PA-C Electronically Signed: Charline Bills, ED Scribe 12/29/2015 at 2:27 PM.   History   Chief Complaint Chief Complaint  Patient presents with  . Chest Pain    HPI Ronnie Hoover is a 30 y.o. male who presents to the Emergency Department complaining of gradually worsening chest wall pain onset 6 days ago. Pt was seen in the ED 2 days ago for 4 days of acute onset of right sided chest pain which occurred after wresting with his father to give him "birthday licks". Pt was discharged with Tylenol and ibuprofen and advised to return if pain worsened. Pt states he returned to work yesterday but noticed increased pain with movement, palpation and deep breaths. He has been compliant with medications without significant relief. He denies hemoptysis or sob.  The history is provided by the patient. No language interpreter was used.    History reviewed. No pertinent past medical history.  There are no active problems to display for this patient.   History reviewed. No pertinent surgical history.   Home Medications    Prior to Admission medications   Medication Sig Start Date End Date Taking? Authorizing Provider  acetaminophen (TYLENOL) 500 MG tablet Take 500 mg by mouth every 6 (six) hours as needed for mild pain.    Historical Provider, MD  ibuprofen (ADVIL,MOTRIN) 600 MG tablet Take 1 tablet (600 mg total) by mouth 3 (three) times daily. 12/27/15   Bethel Born, PA-C  Multiple Vitamin (MULTIVITAMIN WITH MINERALS) TABS tablet Take 1 tablet by mouth daily.    Historical Provider, MD  naproxen sodium (ALEVE) 220 MG tablet Take 220 mg by mouth 2 (two) times daily with a meal.    Historical Provider, MD    Family  History History reviewed. No pertinent family history.  Social History Social History  Substance Use Topics  . Smoking status: Current Every Day Smoker    Packs/day: 1.00    Types: Cigarettes  . Smokeless tobacco: Never Used  . Alcohol use Yes     Comment: occasionally     Allergies   Banana and Tomato   Review of Systems Review of Systems  Respiratory: Negative for shortness of breath.   Musculoskeletal:       + Rib pain    Physical Exam Updated Vital Signs BP (!) 137/102 (BP Location: Left Arm)   Pulse 72   Temp 98.3 F (36.8 C) (Oral)   Resp 16   Ht 6' (1.829 m)   Wt 129 lb 2 oz (58.6 kg)   SpO2 99%   BMI 17.51 kg/m   Physical Exam  Constitutional: He is oriented to person, place, and time. He appears well-developed and well-nourished. No distress.  HENT:  Head: Normocephalic and atraumatic.  Eyes: Conjunctivae and EOM are normal.  Neck: Neck supple. No tracheal deviation present.  Cardiovascular: Normal rate.   Pulmonary/Chest: Effort normal. No respiratory distress.  Tender over the ribcage/R axillary line  No bruising, deformity or crepitus  Musculoskeletal: Normal range of motion.  Neurological: He is alert and oriented to person, place, and time.  Skin: Skin is warm and dry.  Psychiatric: He has a normal mood and affect. His behavior is normal.  Nursing note  and vitals reviewed.   ED Treatments / Results  Labs (all labs ordered are listed, but only abnormal results are displayed) Labs Reviewed - No data to display  EKG  EKG Interpretation None      Radiology Dg Ribs Unilateral Right  Result Date: 12/29/2015 CLINICAL DATA:  Right lower anterior rib pain. EXAM: RIGHT RIBS - 2 VIEW COMPARISON:  Chest x-ray December 27, 2015 FINDINGS: No fracture or other bone lesions are seen involving the ribs. IMPRESSION: Negative. Electronically Signed   By: Gerome Sam III M.D   On: 12/29/2015 14:12   Procedures Procedures (including critical care  time) DIAGNOSTIC STUDIES: Oxygen Saturation is 99% on RA, normal by my interpretation.    COORDINATION OF CARE: 1:36 PM-Discussed treatment plan which includes XR, Naproxen and Lidoderm with pt at bedside and pt agreed to plan.   Medications Ordered in ED Medications - No data to display   Initial Impression / Assessment and Plan / ED Course  I have reviewed the triage vital signs and the nursing notes.  Pertinent labs & imaging results that were available during my care of the patient were reviewed by me and considered in my medical decision making (see chart for details).  Clinical Course    I personally performed the services described in this documentation, which was scribed in my presence. The recorded information has been reviewed and is accurate.  No obvious fractures + rib contusion. No hemoptysis or sob. + htn, advised to f.u with pcp. D/c ibuprofen. Switch to naproxen and lidoderm patches.   Final Clinical Impressions(s) / ED Diagnoses   Final diagnoses:  None    New Prescriptions New Prescriptions   No medications on file     Arthor Captain, PA-C 12/29/15 1452    Shaune Pollack, MD 12/31/15 1119

## 2016-02-26 ENCOUNTER — Encounter (HOSPITAL_COMMUNITY): Payer: Self-pay | Admitting: *Deleted

## 2016-02-26 ENCOUNTER — Emergency Department (HOSPITAL_COMMUNITY)
Admission: EM | Admit: 2016-02-26 | Discharge: 2016-02-26 | Disposition: A | Discharge status: Home / Self Care | Payer: Self-pay | Attending: Emergency Medicine | Admitting: Emergency Medicine

## 2016-02-26 DIAGNOSIS — F17210 Nicotine dependence, cigarettes, uncomplicated: Secondary | ICD-10-CM | POA: Insufficient documentation

## 2016-02-26 DIAGNOSIS — L0231 Cutaneous abscess of buttock: Secondary | ICD-10-CM | POA: Insufficient documentation

## 2016-02-26 MED ORDER — LIDOCAINE HCL (PF) 1 % IJ SOLN
30.0000 mL | Freq: Once | INTRAMUSCULAR | Status: AC
  Administered 2016-02-26: 30 mL
  Filled 2016-02-26: qty 30

## 2016-02-26 MED ORDER — SULFAMETHOXAZOLE-TRIMETHOPRIM 800-160 MG PO TABS: 1.0000 | ORAL_TABLET | Freq: Two times a day (BID) | ORAL | 0 refills | Status: AC

## 2016-02-26 NOTE — ED Provider Notes (Signed)
[facility]-EMERGENCY DEPT Provider Note   CSN: [id number] Arrival date & time: 02/26/16  1752     History   Chief Complaint Chief Complaint  Patient presents with  . Abscess    HPI Ronnie Hoover is a 30 y.o. male.  Patient is 30 yo M with hx of chronic buttock abscesses, presenting with abscess to his right buttock since last week. He states the abscess has gotten progressively larger and more painful. Denies any drainage. He tried warm compresses to provide relief, but it has not subsided. Currently rates the pain 10/10. Denies any fever, chills, rectal pain, or change in bowel habits.      History reviewed. No pertinent past medical history.  There are no active problems to display for this patient.   History reviewed. No pertinent surgical history.     Home Medications    Prior to Admission medications   Medication Sig Start Date End Date Taking? Authorizing Provider  acetaminophen (TYLENOL) 500 MG tablet Take 500 mg by mouth every 6 (six) hours as needed for mild pain.    Historical Provider, MD  lidocaine (LIDODERM) 5 % Place 1 patch onto the skin daily. Remove & Discard patch within 12 hours or as directed by MD 12/29/15   Arthor CaptainAbigail Harris, PA-C  Multiple Vitamin (MULTIVITAMIN WITH MINERALS) TABS tablet Take 1 tablet by mouth daily.    Historical Provider, MD  naproxen (NAPROSYN) 500 MG tablet Take 1 tablet (500 mg total) by mouth 2 (two) times daily with a meal. 12/29/15   Arthor CaptainAbigail Harris, PA-C    Family History History reviewed. No pertinent family history.  Social History Social History  Substance Use Topics  . Smoking status: Current Every Day Smoker    Packs/day: 1.00    Types: Cigarettes  . Smokeless tobacco: Never Used  . Alcohol use Yes     Comment: occasionally     Allergies   Banana and Tomato   Review of Systems Review of Systems  Constitutional: Negative for chills and fever.  Gastrointestinal: Negative for rectal pain.  Skin: Negative for  color change, rash and wound.     Physical Exam Updated Vital Signs BP 124/80 (BP Location: Left Arm)   Pulse 71   Temp 98.3 F (36.8 C) (Oral)   SpO2 100%   Physical Exam  Constitutional:  WDWN male, appears uncomfortable lying supine on stretcher  HENT:  Head: Normocephalic and atraumatic.  Mouth/Throat: Oropharynx is clear and moist.  Eyes: Conjunctivae are normal.  Neck: Normal range of motion.  Cardiovascular: Normal rate.   Pulmonary/Chest: Effort normal. No respiratory distress.  Abdominal: Soft. There is no tenderness.  Genitourinary:  Genitourinary Comments: Chaperone present 3 cm fluctuant and tender mass palpated at medial aspect of right buttock. Indurated tissue surrounding mass. No erythema or drainage noted  Musculoskeletal: Normal range of motion.  Neurological: He is alert.  Skin: Skin is warm and dry.  Nursing note and vitals reviewed.    ED Treatments / Results  Labs (all labs ordered are listed, but only abnormal results are displayed) Labs Reviewed - No data to display  EKG  EKG Interpretation None       Radiology No results found.  EMERGENCY DEPARTMENT US SOFT TISSUE INTERPRETATION "Study: Limited Ultrasound of the noted body part in comments below"  INDICATIONS: Soft tissue infection Multiple views of the body part are obtained with a multi-frequency linear probe  PERFORMED BY:  Myself  IMAGES ARCHIVED?: Yes  SIDE:Midline  BODY PART:Other soft  tisse (comment in note)  FINDINGS: Abcess present and Cellulitis absent  LIMITATIONS:  Emergent Procedure  INTERPRETATION:  Abcess present and No cellulitis noted  COMMENT:  Abscess located at medial aspect of right buttock   Procedures .Marland KitchenIncision and Drainage Date/Time: 02/26/2016 6:45 PM Performed by: Lajean Silvius F Authorized by: Lajean Silvius F   Consent:    Consent obtained:  Verbal   Consent given by:  Patient   Risks discussed:  Bleeding, incomplete  drainage, pain and infection   Alternatives discussed:  No treatment Location:    Type:  Abscess   Size:  3 cm   Location: Medial aspect of right buttock. Pre-procedure details:    Skin preparation:  Betadine Anesthesia (see MAR for exact dosages):    Anesthesia method:  Local infiltration   Local anesthetic:  Lidocaine 1% WITH epi Procedure type:    Complexity:  Simple Procedure details:    Incision types:  Stab incision   Incision depth:  Dermal   Scalpel blade:  11   Wound management:  Irrigated with saline and extensive cleaning   Drainage:  Purulent   Drainage amount:  Copious   Wound treatment:  Wound left open   Packing materials:  None Post-procedure details:    Patient tolerance of procedure:  Tolerated well, no immediate complications Comments:     Indurated tissue surrounding site of abscess, suggestive of chronic abscesses and/or further tracking   Medications Ordered in ED Medications - No data to display   Initial Impression / Assessment and Plan / ED Course  I have reviewed the triage vital signs and the nursing notes.  Pertinent labs & imaging results that were available during my care of the patient were reviewed by me and considered in my medical decision making (see chart for details).  Clinical Course   Patient is 30 yo M presenting with abscess at the medial aspect of right buttock. I&D performed and patient tolerated procedure well. Given location to anogenital region, prescription Bactrim provided. Patient is afebrile, denies any rectal pain or change in bowel habits, and clinically stable for d/c. Contact information for [facility] provided, and patient agreed to f/u for possible surgical debridement given indurated tissue and chronicity of buttock abscesses. Advised to return to ED for new or worsening symptoms including fever, chills, worsening pain, or signs of infection.  Final Clinical Impressions(s) / ED Diagnoses   Final  diagnoses:  None    New Prescriptions Discharge Medication List as of 02/26/2016  7:31 PM    START taking these medications   Details  sulfamethoxazole-trimethoprim (BACTRIM DS,SEPTRA DS) 800-160 MG tablet Take 1 tablet by mouth 2 (two) times daily., Starting Mon 02/26/2016, Until Mon 03/04/2016, Print         Mikhaila Roh F [place], [place] 02/27/16 1610    Margarita Grizzle, MD 02/29/16 1538

## 2016-02-26 NOTE — ED Triage Notes (Signed)
Pt in c/o abscess to his right buttocks since last week, increased pain today, no drainage at this time, no distress noted

## 2016-02-26 NOTE — ED Notes (Signed)
Ultrasound and I&D tray set up at bedside.

## 2016-04-15 ENCOUNTER — Emergency Department (HOSPITAL_COMMUNITY)
Admission: EM | Admit: 2016-04-15 | Discharge: 2016-04-16 | Disposition: A | Discharge status: Home / Self Care | Payer: No Typology Code available for payment source | Attending: Emergency Medicine | Admitting: Emergency Medicine

## 2016-04-15 ENCOUNTER — Encounter (HOSPITAL_COMMUNITY): Payer: Self-pay | Admitting: Emergency Medicine

## 2016-04-15 DIAGNOSIS — Y92410 Unspecified street and highway as the place of occurrence of the external cause: Secondary | ICD-10-CM | POA: Insufficient documentation

## 2016-04-15 DIAGNOSIS — Y999 Unspecified external cause status: Secondary | ICD-10-CM | POA: Insufficient documentation

## 2016-04-15 DIAGNOSIS — R519 Headache, unspecified: Secondary | ICD-10-CM

## 2016-04-15 DIAGNOSIS — S01112A Laceration without foreign body of left eyelid and periocular area, initial encounter: Secondary | ICD-10-CM | POA: Diagnosis present

## 2016-04-15 DIAGNOSIS — M545 Low back pain, unspecified: Secondary | ICD-10-CM

## 2016-04-15 DIAGNOSIS — Y939 Activity, unspecified: Secondary | ICD-10-CM | POA: Diagnosis not present

## 2016-04-15 DIAGNOSIS — R51 Headache: Secondary | ICD-10-CM

## 2016-04-15 DIAGNOSIS — F17210 Nicotine dependence, cigarettes, uncomplicated: Secondary | ICD-10-CM | POA: Insufficient documentation

## 2016-04-15 HISTORY — DX: Bronchitis, not specified as acute or chronic: J40
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
## 2016-04-15 NOTE — ED Notes (Signed)
EDP at bedside  

## 2016-04-16 ENCOUNTER — Emergency Department (HOSPITAL_COMMUNITY): Payer: No Typology Code available for payment source

## 2016-04-16 MED ORDER — CYCLOBENZAPRINE HCL 10 MG PO TABS
10.0000 mg | ORAL_TABLET | Freq: Every day | ORAL | 0 refills | Status: DC
Start: 2016-04-16 — End: 2019-08-02

## 2016-04-16 MED ORDER — IBUPROFEN 600 MG PO TABS
600.0000 mg | ORAL_TABLET | Freq: Three times a day (TID) | ORAL | 0 refills | Status: DC
Start: 2016-04-16 — End: 2017-03-21

## 2016-04-16 MED ORDER — IBUPROFEN 200 MG PO TABS
600.0000 mg | ORAL_TABLET | Freq: Once | ORAL | Status: AC
  Administered 2016-04-16: 600 mg via ORAL
  Filled 2016-04-16: qty 3
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
## 2016-07-26 ENCOUNTER — Emergency Department (HOSPITAL_COMMUNITY): Payer: Self-pay

## 2016-07-26 ENCOUNTER — Encounter (HOSPITAL_COMMUNITY): Payer: Self-pay | Admitting: Emergency Medicine

## 2016-07-26 ENCOUNTER — Emergency Department (HOSPITAL_COMMUNITY)
Admission: EM | Admit: 2016-07-26 | Discharge: 2016-07-26 | Disposition: A | Discharge status: Home / Self Care | Payer: Self-pay | Attending: Emergency Medicine | Admitting: Emergency Medicine

## 2016-07-26 DIAGNOSIS — Y9389 Activity, other specified: Secondary | ICD-10-CM | POA: Insufficient documentation

## 2016-07-26 DIAGNOSIS — S8002XA Contusion of left knee, initial encounter: Secondary | ICD-10-CM

## 2016-07-26 DIAGNOSIS — F17210 Nicotine dependence, cigarettes, uncomplicated: Secondary | ICD-10-CM | POA: Insufficient documentation

## 2016-07-26 DIAGNOSIS — Y92410 Unspecified street and highway as the place of occurrence of the external cause: Secondary | ICD-10-CM | POA: Insufficient documentation

## 2016-07-26 DIAGNOSIS — Y999 Unspecified external cause status: Secondary | ICD-10-CM | POA: Insufficient documentation

## 2016-07-26 MED ORDER — DICLOFENAC SODIUM 50 MG PO TBEC: 50.0000 mg | DELAYED_RELEASE_TABLET | Freq: Two times a day (BID) | ORAL | 0 refills | Status: DC

## 2016-07-26 NOTE — Progress Notes (Signed)
Orthopedic Tech Progress Note Patient Details:  Ronnie Hoover [birth year] [id number]  Ortho Devices Type of Ortho Device: Knee Sleeve Ortho Device/Splint Interventions: Application   Saul FordyceJennifer C Ebrahim Deremer 07/26/2016, 5:42 PM
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
## 2016-07-26 NOTE — ED Notes (Signed)
Paged Ortho  
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
## 2016-09-23 ENCOUNTER — Emergency Department (HOSPITAL_COMMUNITY)
Admission: EM | Admit: 2016-09-23 | Discharge: 2016-09-23 | Disposition: A | Discharge status: Home / Self Care | Payer: Self-pay | Attending: Emergency Medicine | Admitting: Emergency Medicine

## 2016-09-23 ENCOUNTER — Encounter (HOSPITAL_COMMUNITY): Payer: Self-pay

## 2016-09-23 DIAGNOSIS — L0231 Cutaneous abscess of buttock: Secondary | ICD-10-CM | POA: Insufficient documentation

## 2016-09-23 DIAGNOSIS — L03317 Cellulitis of buttock: Secondary | ICD-10-CM

## 2016-09-23 DIAGNOSIS — F17210 Nicotine dependence, cigarettes, uncomplicated: Secondary | ICD-10-CM | POA: Insufficient documentation

## 2016-09-23 MED ORDER — LIDOCAINE-EPINEPHRINE (PF) 2 %-1:200000 IJ SOLN: 20.0000 mL | Freq: Once | INTRAMUSCULAR | Status: DC

## 2016-09-23 MED ORDER — BUPIVACAINE HCL (PF) 0.5 % IJ SOLN
10.0000 mL | Freq: Once | INTRAMUSCULAR | Status: AC
  Administered 2016-09-23: 10 mL

## 2016-09-23 MED ORDER — CEPHALEXIN 500 MG PO CAPS: ORAL_CAPSULE | ORAL | 0 refills | Status: DC

## 2016-09-23 MED ORDER — HYDROCODONE-ACETAMINOPHEN 5-325 MG PO TABS: 1.0000 | ORAL_TABLET | Freq: Four times a day (QID) | ORAL | 0 refills | Status: DC | PRN

## 2016-09-23 MED ORDER — SULFAMETHOXAZOLE-TRIMETHOPRIM 800-160 MG PO TABS: 1.0000 | ORAL_TABLET | Freq: Two times a day (BID) | ORAL | 0 refills | Status: AC

## 2016-09-23 MED ORDER — BUPIVACAINE HCL (PF) 0.5 % IJ SOLN
10.0000 mL | Freq: Once | INTRAMUSCULAR | Status: DC
  Filled 2016-09-23: qty 10

## 2016-09-23 NOTE — ED Provider Notes (Signed)
[facility]EMERGENCY DEPT Provider Note    By signing my name below, I, Earmon Phoenix, attest that this documentation has been prepared under the direction and in the presence of Arthor Captain, PA-C. Electronically Signed: Earmon Phoenix, ED Scribe. 09/23/16. 11:19 AM.    History   Chief Complaint Chief Complaint  Patient presents with  . Recurrent Skin Infections   The history is provided by the patient and medical records. No language interpreter was used.    Ronnie Hoover is a 31 y.o. male with PMHx of recurrent abscesses who presents to the Emergency Department complaining of two abscesses to the right buttock that appeared one week ago. He reports associated severe pain. He has been soaking in warm baths for treatment with no significant relief. Touching the area and sitting on it increases the pain. He denies alleviating factors. He denies pain with bowel movements, fever, chills, nausea, vomiting.   Past Medical History:  Diagnosis Date  . Bronchitis     There are no active problems to display for this patient.   History reviewed. No pertinent surgical history.     Home Medications    Prior to Admission medications   Medication Sig Start Date End Date Taking? Authorizing Provider  acetaminophen (TYLENOL) 500 MG tablet Take 500 mg by mouth every 6 (six) hours as needed for mild pain.    Historical Provider, MD  cephALEXin (KEFLEX) 500 MG capsule 2 caps po bid x 7 days 09/23/16   Arthor Captain, PA-C  cyclobenzaprine (FLEXERIL) 10 MG tablet Take 1 tablet (10 mg total) by mouth at bedtime. 04/16/16   Bethel Born, PA-C  diclofenac (VOLTAREN) 50 MG EC tablet Take 1 tablet (50 mg total) by mouth 2 (two) times daily. 07/26/16   Hope Orlene Och, NP  HYDROcodone-acetaminophen (NORCO) 5-325 MG tablet Take 1-2 tablets by mouth every 6 (six) hours as needed for severe pain. 09/23/16   Arthor Captain, PA-C  ibuprofen (ADVIL,MOTRIN) 600 MG tablet Take 1 tablet (600 mg total) by  mouth 3 (three) times daily. 04/16/16   Bethel Born, PA-C  lidocaine (LIDODERM) 5 % Place 1 patch onto the skin daily. Remove & Discard patch within 12 hours or as directed by MD 12/29/15   Arthor Captain, PA-C  Multiple Vitamin (MULTIVITAMIN WITH MINERALS) TABS tablet Take 1 tablet by mouth daily.    Historical Provider, MD  naproxen (NAPROSYN) 500 MG tablet Take 1 tablet (500 mg total) by mouth 2 (two) times daily with a meal. 12/29/15   Arthor Captain, PA-C  sulfamethoxazole-trimethoprim (BACTRIM DS,SEPTRA DS) 800-160 MG tablet Take 1 tablet by mouth 2 (two) times daily. 09/23/16 09/30/16  Arthor Captain, PA-C    Family History No family history on file.  Social History Social History  Substance Use Topics  . Smoking status: Current Every Day Smoker    Packs/day: 1.00    Types: Cigarettes  . Smokeless tobacco: Never Used  . Alcohol use Yes     Comment: occasionally     Allergies   Banana and Tomato   Review of Systems Review of Systems  Constitutional: Negative for chills and fever.  Gastrointestinal: Negative for nausea and vomiting.  Skin:       Abscess to left buttock     Physical Exam Updated Vital Signs BP 103/83 (BP Location: Right Arm)   Pulse (!) 104   Temp 98.3 F (36.8 C) (Oral)   Resp 16   Ht 6' (1.829 m)   Wt 145 lb (65.8  kg)   SpO2 98%   BMI 19.67 kg/m   Physical Exam  Constitutional: He is oriented to person, place, and time. He appears well-developed and well-nourished.  HENT:  Head: Normocephalic and atraumatic.  Neck: Normal range of motion.  Cardiovascular: Normal rate.   Pulmonary/Chest: Effort normal.  Musculoskeletal: Normal range of motion.  Neurological: He is alert and oriented to person, place, and time.  Skin: Skin is warm and dry.  20 cm of induration of the R gluteal cleft.  4 cm of fluctuance  2cm of induration about 8 cm inferior to abscess.  Psychiatric: He has a normal mood and affect. His behavior is normal.  Nursing note  and vitals reviewed.    ED Treatments / Results  DIAGNOSTIC STUDIES: Oxygen Saturation is 98% on RA, normal by my interpretation.   COORDINATION OF CARE: 10:37 AM- Will perform bedside ultrasound. Pt verbalizes understanding and agrees to plan.  10:41 AM- Will incise and drain both abscesses.   Medications  bupivacaine (MARCAINE) 0.5 % injection 10 mL (10 mLs Infiltration Given 09/23/16 1044)    Labs (all labs ordered are listed, but only abnormal results are displayed) Labs Reviewed - No data to display  EKG  EKG Interpretation None       Radiology No results found.  Procedures [place] bedside Date/Time: 09/23/2016 10:37 AM Performed by: Arthor Captain Authorized by: Arthor Captain  Consent: Verbal consent obtained. Consent given by: patient Patient understanding: patient states understanding of the procedure being performed Patient consent: the patient's understanding of the procedure matches consent given Procedure consent: procedure consent matches procedure scheduled Relevant documents: relevant documents present and verified Required items: required blood products, implants, devices, and special equipment available Patient identity confirmed: verbally with patient Preparation: Patient was prepped and draped in the usual sterile fashion. Local anesthesia used: no  Anesthesia: Local anesthesia used: no  Sedation: Patient sedated: no Patient tolerance: Patient tolerated the procedure well with no immediate complications  .Marland KitchenIncision and Drainage Date/Time: 09/23/2016 10:50 AM Performed by: Arthor Captain Authorized by: Arthor Captain   Consent:    Consent obtained:  Verbal   Consent given by:  Patient Location:    Type:  Abscess   Location:  Anogenital   Anogenital location:  Gluteal cleft Pre-procedure details:    Skin preparation:  Betadine Anesthesia (see MAR for exact dosages):    Anesthesia method:  Local infiltration   Local anesthetic:   Lidocaine 2% WITH epi Procedure type:    Complexity:  Simple Procedure details:    Needle aspiration: no     Incision types:  Stab incision and single straight   Incision depth:  Subcutaneous   Scalpel blade:  11   Wound management:  Probed and deloculated and irrigated with saline   Drainage:  Purulent   Drainage amount:  Copious   Wound treatment:  Wound left open   Packing materials:  1/4 in iodoform gauze Post-procedure details:    Patient tolerance of procedure:  Tolerated well, no immediate complications .Marland KitchenIncision and Drainage Date/Time: 09/23/2016 10:50 AM Performed by: Arthor Captain Authorized by: Arthor Captain   Consent:    Consent obtained:  Verbal   Consent given by:  Patient Location:    Type:  Abscess   Location:  Anogenital   Anogenital location:  Gluteal cleft Pre-procedure details:    Skin preparation:  Betadine Anesthesia (see MAR for exact dosages):    Anesthesia method:  Local infiltration   Local anesthetic:  Lidocaine 2% WITH epi  Procedure type:    Complexity:  Simple Procedure details:    Needle aspiration: no     Incision types:  Stab incision and single straight   Incision depth:  Subcutaneous   Scalpel blade:  11   Wound management:  Probed and deloculated and irrigated with saline   Drainage:  Bloody   Drainage amount:  Scant   Packing materials:  None Post-procedure details:    Patient tolerance of procedure:  Tolerated well, no immediate complications   (including critical care time)  Medications Ordered in ED Medications  bupivacaine (MARCAINE) 0.5 % injection 10 mL (10 mLs Infiltration Given 09/23/16 1044)     Initial Impression / Assessment and Plan / ED Course  I have reviewed the triage vital signs and the nursing notes.  Pertinent labs & imaging results that were available during my care of the patient were reviewed by me and considered in my medical decision making (see chart for details).     Patient with skin  abscesses. Incision and drainage performed in the ED today.  Abscess was large enough to warrant packing and 1/4 inch Iodoform gauze was placed. Wound recheck and packing removal in 2 days. Supportive care and return precautions discussed.  Pt sent home with Keflex and Bactrim. The patient appears reasonably screened and/or stabilized for discharge and I doubt any other emergent medical condition requiring further screening, evaluation, or treatment in the ED prior to discharge.    Final Clinical Impressions(s) / ED Diagnoses   Final diagnoses:  Abscess and cellulitis of gluteal region    New Prescriptions New Prescriptions   CEPHALEXIN (KEFLEX) 500 MG CAPSULE    2 caps po bid x 7 days   HYDROCODONE-ACETAMINOPHEN (NORCO) 5-325 MG TABLET    Take 1-2 tablets by mouth every 6 (six) hours as needed for severe pain.   SULFAMETHOXAZOLE-TRIMETHOPRIM (BACTRIM DS,SEPTRA DS) 800-160 MG TABLET    Take 1 tablet by mouth 2 (two) times daily.    I personally performed the services described in this documentation, which was scribed in my presence. The recorded information has been reviewed and is accurate.   Arthor Captain, PA-C 09/23/16 [address], PA-C 09/23/16 1130    Nira Conn, MD 09/24/16 [phone]

## 2016-09-23 NOTE — Discharge Instructions (Signed)
Follow these instructions at home: Take over-the-counter and prescription medicines only as told by your health care provider. If you were prescribed an antibiotic medicine, take it as told by your health care provider. Do not stop taking the antibiotic even if you start to feel better. Follow instructions from your health care provider about: How to take care of your incision. When and how you should change your packing and bandage (dressing). Wash your hands with soap and water before you change your dressing. If soap and water are not available, use hand sanitizer. When you should remove your dressing. Do not take baths, swim, or use a hot tub until your health care provider approves. Keep all follow-up visits as told by your health care provider. This is important. Check your incision area every day for signs of infection. Check for: More redness, swelling, or pain. More fluid or blood. Warmth. Pus or a bad smell. Contact a health care provider if: Your cyst or abscess returns. You have a fever. You have more redness, swelling, or pain around your incision. You have more fluid or blood coming from your incision. Your incision feels warm to the touch. You have pus or a bad smell coming from your incision. Get help right away if: You have severe pain or bleeding. You cannot eat or drink without vomiting. You have decreased urine output. You become short of breath. You have chest pain. You cough up blood. The area where the incision and drainage occurred becomes numb or it tingles.

## 2016-09-23 NOTE — ED Triage Notes (Signed)
Per T, Pt reports two recurrent boils on the right buttocks. Denies fever

## 2017-02-18 ENCOUNTER — Emergency Department (HOSPITAL_COMMUNITY): Payer: Self-pay

## 2017-02-18 ENCOUNTER — Encounter (HOSPITAL_COMMUNITY): Payer: Self-pay | Admitting: Emergency Medicine

## 2017-02-18 ENCOUNTER — Emergency Department (HOSPITAL_COMMUNITY)
Admission: EM | Admit: 2017-02-18 | Discharge: 2017-02-18 | Disposition: A | Discharge status: Home / Self Care | Payer: Self-pay | Attending: Emergency Medicine | Admitting: Emergency Medicine

## 2017-02-18 DIAGNOSIS — S7001XA Contusion of right hip, initial encounter: Secondary | ICD-10-CM | POA: Insufficient documentation

## 2017-02-18 DIAGNOSIS — S301XXA Contusion of abdominal wall, initial encounter: Secondary | ICD-10-CM

## 2017-02-18 DIAGNOSIS — Y998 Other external cause status: Secondary | ICD-10-CM | POA: Insufficient documentation

## 2017-02-18 DIAGNOSIS — Y929 Unspecified place or not applicable: Secondary | ICD-10-CM | POA: Insufficient documentation

## 2017-02-18 DIAGNOSIS — F17210 Nicotine dependence, cigarettes, uncomplicated: Secondary | ICD-10-CM | POA: Insufficient documentation

## 2017-02-18 DIAGNOSIS — W228XXA Striking against or struck by other objects, initial encounter: Secondary | ICD-10-CM | POA: Insufficient documentation

## 2017-02-18 DIAGNOSIS — Y939 Activity, unspecified: Secondary | ICD-10-CM | POA: Insufficient documentation

## 2017-02-18 MED ORDER — METHYLPREDNISOLONE 4 MG PO TBPK: ORAL_TABLET | ORAL | 0 refills | Status: DC

## 2017-02-18 MED ORDER — MELOXICAM 15 MG PO TABS: 15.0000 mg | ORAL_TABLET | Freq: Every day | ORAL | 0 refills | Status: DC

## 2017-02-18 NOTE — ED Provider Notes (Signed)
[facility] Provider Note   CSN: [id number] Arrival date & time: 02/18/17  1223     History   Chief Complaint Chief Complaint  Patient presents with  . Hip Pain    HPI Ronnie Hoover is a 31 y.o. male who presents emergency Department with chief complaint of right hip pain. Patient states that one week ago he got hit very hard in the anterior right hip with a metal part of a door. Since that time he has had significant pain. He's been taking Motrin which decreases his pain symptoms however he continues to have some pretty significant pain and difficulty doing his work. He denies weakness, numbness. He is a previous injuries to the hip.  HPI  Past Medical History:  Diagnosis Date  . Bronchitis     There are no active problems to display for this patient.   History reviewed. No pertinent surgical history.     Home Medications    Prior to Admission medications   Medication Sig Start Date End Date Taking? Authorizing Provider  acetaminophen (TYLENOL) 500 MG tablet Take 500 mg by mouth every 6 (six) hours as needed for mild pain.    [provider]  cephALEXin (KEFLEX) 500 MG capsule 2 caps po bid x 7 days 09/23/16   Arthor Captain, PA-C  cyclobenzaprine (FLEXERIL) 10 MG tablet Take 1 tablet (10 mg total) by mouth at bedtime. 04/16/16   Bethel Born, PA-C  diclofenac (VOLTAREN) 50 MG EC tablet Take 1 tablet (50 mg total) by mouth 2 (two) times daily. 07/26/16   Janne Napoleon, NP  HYDROcodone-acetaminophen (NORCO) 5-325 MG tablet Take 1-2 tablets by mouth every 6 (six) hours as needed for severe pain. 09/23/16   Haillie Radu, Cammy Copa, PA-C  ibuprofen (ADVIL,MOTRIN) 600 MG tablet Take 1 tablet (600 mg total) by mouth 3 (three) times daily. 04/16/16   Bethel Born, PA-C  lidocaine (LIDODERM) 5 % Place 1 patch onto the skin daily. Remove & Discard patch within 12 hours or as directed by MD 12/29/15   Arthor Captain, PA-C  meloxicam (MOBIC) 15 MG tablet Take 1  tablet (15 mg total) by mouth daily. 02/18/17   Arthor Captain, PA-C  methylPREDNISolone (MEDROL DOSEPAK) 4 MG TBPK tablet Use as directed 02/18/17   Arthor Captain, PA-C  Multiple Vitamin (MULTIVITAMIN WITH MINERALS) TABS tablet Take 1 tablet by mouth daily.    [provider]  naproxen (NAPROSYN) 500 MG tablet Take 1 tablet (500 mg total) by mouth 2 (two) times daily with a meal. 12/29/15   Arthor Captain, PA-C    Family History History reviewed. No pertinent family history.  Social History Social History  Substance Use Topics  . Smoking status: Current Every Day Smoker    Packs/day: 1.00    Types: Cigarettes  . Smokeless tobacco: Never Used  . Alcohol use Yes     Comment: occasionally     Allergies   Banana and Tomato   Review of Systems Review of Systems Positive for hip pain, negative for weakness or numbness, negative for bruising  Physical Exam Updated Vital Signs BP 119/78 (BP Location: Right Arm)   Pulse 71   Temp 98.1 F (36.7 C) (Oral)   Resp 16   Wt 59 kg (130 lb)   SpO2 98%   BMI 17.63 kg/m   Physical Exam  Physical Exam  Nursing note and vitals reviewed. Constitutional: He appears well-developed and well-nourished. No distress.  HENT:  Head: Normocephalic and atraumatic.  Eyes: Conjunctivae normal are normal. No scleral icterus.  Neck: Normal range of motion. Neck supple.  Cardiovascular: Normal rate, regular rhythm and normal heart sounds.   Pulmonary/Chest: Effort normal and breath sounds normal. No respiratory distress.  Abdominal: Soft. There is no tenderness.  Musculoskeletal: He exhibits no edema.  no bruising to the right lateral hip. Exquisitely tender to palpation of the anterior right hip flexor, no gluteal pain, pain with internal rotation of the hip.  Neurological: He is alert.  Skin: Skin is warm and dry. He is not diaphoretic.  Psychiatric: His behavior is normal.    ED Treatments / Results  Labs (all labs ordered are  listed, but only abnormal results are displayed) Labs Reviewed - No data to display  EKG  EKG Interpretation None       Radiology Dg Hip Unilat  With Pelvis 2-3 Views Right  Result Date: 02/18/2017 CLINICAL DATA:  Right anterolateral hip pain x Wed, pt stated he was playing with his kids and ran into screen door which caused him to "jolt" his right hip, hurts to bear weight and stated leg was numb today, smoker - 1 ppd EXAM: DG HIP (WITH OR WITHOUT PELVIS) 2-3V RIGHT COMPARISON:  None. FINDINGS: There is no evidence of hip fracture or dislocation. There is no evidence of arthropathy or other focal bone abnormality. IMPRESSION: Negative. Electronically Signed   By: Norva Pavlov M.D.   On: 02/18/2017 13:14    Procedures Procedures (including critical care time)  Medications Ordered in ED Medications - No data to display   Initial Impression / Assessment and Plan / ED Course  I have reviewed the triage vital signs and the nursing notes.  Pertinent labs & imaging results that were available during my care of the patient were reviewed by me and considered in my medical decision making (see chart for details).     Patient with apparent hip pointer or injury. His pain seems somewhat out of proportion to the injury and given length of time I suspect the contusion. Patient will be discharged with anti-inflammatory, Medrol Dosepak, follow up with orthopedics, discussed return precautions with the patient appears safe for discharge at this time  Final Clinical Impressions(s) / ED Diagnoses   Final diagnoses:  Hip pointer, initial encounter    New Prescriptions New Prescriptions   MELOXICAM (MOBIC) 15 MG TABLET    Take 1 tablet (15 mg total) by mouth daily.   METHYLPREDNISOLONE (MEDROL DOSEPAK) 4 MG TBPK TABLET    Use as directed     Arthor Captain, PA-C 02/18/17 1500    Melene Plan, DO 03/21/17 2256

## 2017-02-18 NOTE — ED Triage Notes (Signed)
Pt reports he fell into the screen door approx 1 week ago. Pt with continued pain. Pt ambulatory, VSS.

## 2017-02-18 NOTE — ED Notes (Signed)
Returned from xray

## 2017-02-18 NOTE — Discharge Instructions (Signed)
Contact a health care provider if: You have bruising or swelling that gets worse. You have pain that gets worse or does not get better with medicine. You have symptoms that last longer than 2 weeks. You develop new symptoms. Get help right away if: You have numbness or a ?pins and needles? feeling in your feet or your legs. You are unable to walk because of your pain.

## 2017-02-18 NOTE — ED Notes (Signed)
Patient transported to X-ray 

## 2017-02-18 NOTE — ED Notes (Signed)
Pt states he fell into his screen door, striking his hip on metal frame. Has persistent right hip pain since then.

## 2017-03-20 ENCOUNTER — Emergency Department (HOSPITAL_COMMUNITY): Payer: Self-pay | Admitting: Certified Registered"

## 2017-03-20 ENCOUNTER — Encounter (HOSPITAL_COMMUNITY): Payer: Self-pay | Admitting: *Deleted

## 2017-03-20 ENCOUNTER — Emergency Department (HOSPITAL_COMMUNITY): Payer: Self-pay

## 2017-03-20 ENCOUNTER — Encounter (HOSPITAL_COMMUNITY)
Admission: EM | Disposition: A | Discharge status: Home / Self Care | Payer: Self-pay | Source: Home / Self Care | Attending: Emergency Medicine

## 2017-03-20 ENCOUNTER — Observation Stay (HOSPITAL_COMMUNITY)
Admission: EM | Admit: 2017-03-20 | Discharge: 2017-03-21 | Disposition: A | Discharge status: Home / Self Care | Payer: Self-pay | Attending: Surgery | Admitting: Surgery

## 2017-03-20 DIAGNOSIS — F17210 Nicotine dependence, cigarettes, uncomplicated: Secondary | ICD-10-CM | POA: Insufficient documentation

## 2017-03-20 DIAGNOSIS — Z91018 Allergy to other foods: Secondary | ICD-10-CM | POA: Insufficient documentation

## 2017-03-20 DIAGNOSIS — D729 Disorder of white blood cells, unspecified: Secondary | ICD-10-CM

## 2017-03-20 DIAGNOSIS — Z23 Encounter for immunization: Secondary | ICD-10-CM | POA: Insufficient documentation

## 2017-03-20 DIAGNOSIS — L0231 Cutaneous abscess of buttock: Secondary | ICD-10-CM

## 2017-03-20 DIAGNOSIS — K603 Anal fistula: Secondary | ICD-10-CM

## 2017-03-20 DIAGNOSIS — K610 Anal abscess: Principal | ICD-10-CM | POA: Insufficient documentation

## 2017-03-20 DIAGNOSIS — Z79899 Other long term (current) drug therapy: Secondary | ICD-10-CM | POA: Insufficient documentation

## 2017-03-20 HISTORY — DX: Cutaneous abscess, unspecified: L02.91

## 2017-03-20 HISTORY — PX: IRRIGATION AND DEBRIDEMENT ABSCESS: SHX5252

## 2017-03-20 LAB — CBC WITH DIFFERENTIAL/PLATELET
BASOS PCT: 0 %
Basophils Absolute: 0 10*3/uL (ref 0.0–0.1)
Eosinophils Absolute: 0.1 10*3/uL (ref 0.0–0.7)
Eosinophils Relative: 1 %
HEMATOCRIT: 46.7 % (ref 39.0–52.0)
HEMOGLOBIN: 16.3 g/dL (ref 13.0–17.0)
LYMPHS PCT: 11 %
Lymphs Abs: 1.5 10*3/uL (ref 0.7–4.0)
MCH: 33.9 pg (ref 26.0–34.0)
MCHC: 34.9 g/dL (ref 30.0–36.0)
MCV: 97.1 fL (ref 78.0–100.0)
MONO ABS: 1.6 10*3/uL — AB (ref 0.1–1.0)
Monocytes Relative: 13 %
NEUTROS ABS: 9.8 10*3/uL — AB (ref 1.7–7.7)
NEUTROS PCT: 75 %
Platelets: 238 10*3/uL (ref 150–400)
RBC: 4.81 MIL/uL (ref 4.22–5.81)
RDW: 12.7 % (ref 11.5–15.5)
WBC: 13 10*3/uL — ABNORMAL HIGH (ref 4.0–10.5)

## 2017-03-20 LAB — BASIC METABOLIC PANEL
ANION GAP: 8 (ref 5–15)
BUN: 5 mg/dL — ABNORMAL LOW (ref 6–20)
CHLORIDE: 106 mmol/L (ref 101–111)
CO2: 24 mmol/L (ref 22–32)
CREATININE: 1.01 mg/dL (ref 0.61–1.24)
Calcium: 9.3 mg/dL (ref 8.9–10.3)
GFR calc non Af Amer: >60 mL/min (ref >60)
GLUCOSE: 106 mg/dL — AB (ref 65–99)
Potassium: 3.8 mmol/L (ref 3.5–5.1)
Sodium: 138 mmol/L (ref 135–145)

## 2017-03-20 SURGERY — IRRIGATION AND DEBRIDEMENT ABSCESS
Anesthesia: General | Laterality: Right

## 2017-03-20 MED ORDER — MORPHINE SULFATE (PF) 4 MG/ML IV SOLN
4.0000 mg | Freq: Once | INTRAVENOUS | Status: AC
  Administered 2017-03-20: 4 mg via INTRAVENOUS
  Filled 2017-03-20: qty 1

## 2017-03-20 MED ORDER — CEFAZOLIN SODIUM-DEXTROSE 2-3 GM-%(50ML) IV SOLR
INTRAVENOUS | Status: DC | PRN
  Administered 2017-03-20: 2 g via INTRAVENOUS

## 2017-03-20 MED ORDER — ACETAMINOPHEN 500 MG PO TABS
1000.0000 mg | ORAL_TABLET | Freq: Four times a day (QID) | ORAL | Status: DC
  Administered 2017-03-20 – 2017-03-21 (×3): 1000 mg via ORAL
  Filled 2017-03-20 (×3): qty 2

## 2017-03-20 MED ORDER — HYDROMORPHONE HCL 1 MG/ML IJ SOLN: 0.2500 mg | INTRAMUSCULAR | Status: DC | PRN

## 2017-03-20 MED ORDER — ROCURONIUM BROMIDE 10 MG/ML (PF) SYRINGE
PREFILLED_SYRINGE | INTRAVENOUS | Status: DC | PRN
  Administered 2017-03-20: 40 mg via INTRAVENOUS

## 2017-03-20 MED ORDER — FENTANYL CITRATE (PF) 250 MCG/5ML IJ SOLN
INTRAMUSCULAR | Status: DC | PRN
  Administered 2017-03-20: 100 ug via INTRAVENOUS

## 2017-03-20 MED ORDER — OXYCODONE HCL 5 MG PO TABS: 5.0000 mg | ORAL_TABLET | Freq: Once | ORAL | Status: DC | PRN

## 2017-03-20 MED ORDER — SUGAMMADEX SODIUM 200 MG/2ML IV SOLN
INTRAVENOUS | Status: DC | PRN
  Administered 2017-03-20: 200 mg via INTRAVENOUS

## 2017-03-20 MED ORDER — PROPOFOL 10 MG/ML IV BOLUS
INTRAVENOUS | Status: AC
  Filled 2017-03-20: qty 20

## 2017-03-20 MED ORDER — HYDROMORPHONE HCL 1 MG/ML IJ SOLN: 0.5000 mg | INTRAMUSCULAR | Status: DC | PRN

## 2017-03-20 MED ORDER — DEXAMETHASONE SODIUM PHOSPHATE 10 MG/ML IJ SOLN
INTRAMUSCULAR | Status: DC | PRN
  Administered 2017-03-20: 10 mg via INTRAVENOUS

## 2017-03-20 MED ORDER — ONDANSETRON HCL 4 MG/2ML IJ SOLN: 4.0000 mg | Freq: Once | INTRAMUSCULAR | Status: DC | PRN

## 2017-03-20 MED ORDER — OXYCODONE HCL 5 MG/5ML PO SOLN: 5.0000 mg | Freq: Once | ORAL | Status: DC | PRN

## 2017-03-20 MED ORDER — ONDANSETRON HCL 4 MG/2ML IJ SOLN
4.0000 mg | Freq: Once | INTRAMUSCULAR | Status: AC
  Administered 2017-03-20: 4 mg via INTRAVENOUS
  Filled 2017-03-20: qty 2

## 2017-03-20 MED ORDER — FENTANYL CITRATE (PF) 250 MCG/5ML IJ SOLN
INTRAMUSCULAR | Status: AC
  Filled 2017-03-20: qty 5

## 2017-03-20 MED ORDER — LACTATED RINGERS IV SOLN: INTRAVENOUS | Status: DC

## 2017-03-20 MED ORDER — 0.9 % SODIUM CHLORIDE (POUR BTL) OPTIME
TOPICAL | Status: DC | PRN
  Administered 2017-03-20: 1000 mL

## 2017-03-20 MED ORDER — LACTATED RINGERS IV SOLN
INTRAVENOUS | Status: DC
  Administered 2017-03-20: 18:00:00 via INTRAVENOUS

## 2017-03-20 MED ORDER — MIDAZOLAM HCL 2 MG/2ML IJ SOLN
INTRAMUSCULAR | Status: AC
  Filled 2017-03-20: qty 2

## 2017-03-20 MED ORDER — SUCCINYLCHOLINE CHLORIDE 200 MG/10ML IV SOSY
PREFILLED_SYRINGE | INTRAVENOUS | Status: DC | PRN
  Administered 2017-03-20: 100 mg via INTRAVENOUS

## 2017-03-20 MED ORDER — PROPOFOL 10 MG/ML IV BOLUS
INTRAVENOUS | Status: DC | PRN
Start: 2017-03-20 — End: 2017-03-20
  Administered 2017-03-20: 150 mg via INTRAVENOUS

## 2017-03-20 MED ORDER — MIDAZOLAM HCL 2 MG/2ML IJ SOLN
INTRAMUSCULAR | Status: DC | PRN
  Administered 2017-03-20: 2 mg via INTRAVENOUS

## 2017-03-20 MED ORDER — IOPAMIDOL (ISOVUE-300) INJECTION 61%
INTRAVENOUS | Status: AC
  Administered 2017-03-20: 100 mL
  Filled 2017-03-20: qty 100

## 2017-03-20 MED ORDER — OXYCODONE-ACETAMINOPHEN 5-325 MG PO TABS
1.0000 | ORAL_TABLET | ORAL | Status: DC | PRN
  Administered 2017-03-20: 1 via ORAL

## 2017-03-20 MED ORDER — INFLUENZA VAC SPLIT QUAD 0.5 ML IM SUSY
0.5000 mL | PREFILLED_SYRINGE | INTRAMUSCULAR | Status: AC
  Administered 2017-03-21: 0.5 mL via INTRAMUSCULAR
  Filled 2017-03-20: qty 0.5

## 2017-03-20 MED ORDER — LACTATED RINGERS IV SOLN
INTRAVENOUS | Status: DC | PRN
  Administered 2017-03-20: 18:00:00 via INTRAVENOUS

## 2017-03-20 MED ORDER — OXYCODONE-ACETAMINOPHEN 5-325 MG PO TABS
ORAL_TABLET | ORAL | Status: AC
  Administered 2017-03-20: 1 via ORAL
  Filled 2017-03-20: qty 1

## 2017-03-20 MED ORDER — LIDOCAINE 2% (20 MG/ML) 5 ML SYRINGE
INTRAMUSCULAR | Status: DC | PRN
  Administered 2017-03-20: 100 mg via INTRAVENOUS

## 2017-03-20 MED ORDER — ONDANSETRON HCL 4 MG/5ML PO SOLN
4.0000 mg | Freq: Four times a day (QID) | ORAL | Status: DC | PRN
  Filled 2017-03-20: qty 5

## 2017-03-20 MED ORDER — OXYCODONE HCL 5 MG PO TABS
5.0000 mg | ORAL_TABLET | Freq: Four times a day (QID) | ORAL | Status: DC | PRN
  Administered 2017-03-21: 10 mg via ORAL
  Filled 2017-03-20: qty 2

## 2017-03-20 MED ORDER — ONDANSETRON HCL 4 MG/2ML IJ SOLN
INTRAMUSCULAR | Status: DC | PRN
Start: 2017-03-20 — End: 2017-03-20
  Administered 2017-03-20: 4 mg via INTRAVENOUS

## 2017-03-20 MED ORDER — PNEUMOCOCCAL VAC POLYVALENT 25 MCG/0.5ML IJ INJ
0.5000 mL | INJECTION | INTRAMUSCULAR | Status: AC
  Administered 2017-03-21: 0.5 mL via INTRAMUSCULAR
  Filled 2017-03-20: qty 0.5

## 2017-03-20 MED ORDER — HEPARIN SODIUM (PORCINE) 5000 UNIT/ML IJ SOLN
5000.0000 [IU] | Freq: Three times a day (TID) | INTRAMUSCULAR | Status: DC
  Administered 2017-03-20 – 2017-03-21 (×2): 5000 [IU] via SUBCUTANEOUS
  Filled 2017-03-20 (×2): qty 1

## 2017-03-20 SURGICAL SUPPLY — 42 items
BLADE CLIPPER SURG (BLADE) IMPLANT
BNDG GAUZE ELAST 4 BULKY (GAUZE/BANDAGES/DRESSINGS) IMPLANT
CANISTER SUCT 3000ML PPV (MISCELLANEOUS) ×2 IMPLANT
COVER SURGICAL LIGHT HANDLE (MISCELLANEOUS) ×2 IMPLANT
DRAIN PENROSE 18X1/4 LTX STRL (WOUND CARE) ×1 IMPLANT
DRAPE LAPAROSCOPIC ABDOMINAL (DRAPES) ×1 IMPLANT
DRAPE LAPAROTOMY 100X72 PEDS (DRAPES) IMPLANT
DRAPE UTILITY XL STRL (DRAPES) ×2 IMPLANT
DRSG PAD ABDOMINAL 8X10 ST (GAUZE/BANDAGES/DRESSINGS) ×1 IMPLANT
ELECT CAUTERY BLADE 6.4 (BLADE) ×2 IMPLANT
ELECT REM PT RETURN 9FT ADLT (ELECTROSURGICAL) ×2
ELECTRODE REM PT RTRN 9FT ADLT (ELECTROSURGICAL) ×1 IMPLANT
GAUZE PACKING IODOFORM 1/2 (PACKING) IMPLANT
GAUZE SPONGE 4X4 12PLY STRL (GAUZE/BANDAGES/DRESSINGS) ×1 IMPLANT
GAUZE SPONGE 4X4 16PLY XRAY LF (GAUZE/BANDAGES/DRESSINGS) ×1 IMPLANT
GLOVE BIOGEL M STRL SZ7.5 (GLOVE) ×2 IMPLANT
GLOVE BIOGEL PI IND STRL 8 (GLOVE) ×1 IMPLANT
GLOVE BIOGEL PI INDICATOR 8 (GLOVE) ×1
GOWN STRL REUS W/ TWL LRG LVL3 (GOWN DISPOSABLE) ×1 IMPLANT
GOWN STRL REUS W/ TWL XL LVL3 (GOWN DISPOSABLE) ×1 IMPLANT
GOWN STRL REUS W/TWL LRG LVL3 (GOWN DISPOSABLE) ×2
GOWN STRL REUS W/TWL XL LVL3 (GOWN DISPOSABLE) ×2
KIT BASIN OR (CUSTOM PROCEDURE TRAY) ×2 IMPLANT
KIT ROOM TURNOVER OR (KITS) ×2 IMPLANT
LOOP VESSEL MAXI BLUE (MISCELLANEOUS) ×1 IMPLANT
NS IRRIG 1000ML POUR BTL (IV SOLUTION) ×2 IMPLANT
PACK LITHOTOMY IV (CUSTOM PROCEDURE TRAY) IMPLANT
PACK SURGICAL SETUP 50X90 (CUSTOM PROCEDURE TRAY) IMPLANT
PAD ARMBOARD 7.5X6 YLW CONV (MISCELLANEOUS) ×2 IMPLANT
PENCIL BUTTON HOLSTER BLD 10FT (ELECTRODE) ×2 IMPLANT
SPONGE LAP 18X18 X RAY DECT (DISPOSABLE) ×2 IMPLANT
SUT SILK 0 TIES 10X30 (SUTURE) ×1 IMPLANT
SUT SILK 3 0 FS 1X18 (SUTURE) ×1 IMPLANT
SUT SILK 3 0 TIES 10X30 (SUTURE) ×1 IMPLANT
SWAB COLLECTION DEVICE MRSA (MISCELLANEOUS) ×1 IMPLANT
SWAB CULTURE ESWAB REG 1ML (MISCELLANEOUS) ×1 IMPLANT
TOWEL OR 17X24 6PK STRL BLUE (TOWEL DISPOSABLE) ×2 IMPLANT
TOWEL OR 17X26 10 PK STRL BLUE (TOWEL DISPOSABLE) ×2 IMPLANT
TUBE ANAEROBIC SPECIMEN COL (MISCELLANEOUS) ×1 IMPLANT
TUBE CONNECTING 12X1/4 (SUCTIONS) ×2 IMPLANT
UNDERPAD 30X30 INCONTINENT (UNDERPADS AND DIAPERS) ×1 IMPLANT
YANKAUER SUCT BULB TIP NO VENT (SUCTIONS) ×2 IMPLANT

## 2017-03-20 NOTE — Anesthesia Preprocedure Evaluation (Signed)
Anesthesia Evaluation  Patient identified by MRN, date of birth, ID band Patient awake    Reviewed: Allergy & Precautions, NPO status , Patient's Chart, lab work & pertinent test results  Airway Mallampati: II  TM Distance: >3 FB Neck ROM: Full    Dental  (+) Teeth Intact, Dental Advisory Given   Pulmonary Current Smoker,    breath sounds clear to auscultation       Cardiovascular  Rhythm:Regular Rate:Normal     Neuro/Psych    GI/Hepatic   Endo/Other    Renal/GU      Musculoskeletal   Abdominal   Peds  Hematology   Anesthesia Other Findings   Reproductive/Obstetrics                             Anesthesia Physical Anesthesia Plan  ASA: I  Anesthesia Plan: General   Post-op Pain Management:    Induction: Intravenous  PONV Risk Score and Plan: Ondansetron and Dexamethasone  Airway Management Planned: Simple Face Mask and Natural Airway  Additional Equipment:   Intra-op Plan:   Post-operative Plan:   Informed Consent: I have reviewed the patients History and Physical, chart, labs and discussed the procedure including the risks, benefits and alternatives for the proposed anesthesia with the patient or authorized representative who has indicated his/her understanding and acceptance.   Dental advisory given  Plan Discussed with: Anesthesiologist and CRNA  Anesthesia Plan Comments:         Anesthesia Quick Evaluation

## 2017-03-20 NOTE — ED Provider Notes (Signed)
[facility] Provider Note   CSN: [id number] Arrival date & time: 03/20/17  0932     History   Chief Complaint Chief Complaint  Patient presents with  . Abscess    HPI Ronnie Hoover is a 31 y.o. male with a PMHx of recurrent buttock abscesses, who presents to the ED with complaints of another right buttock abscess that started yesterday. Patient states this one is in the exact same location as his last buttock abscess, he has had multiple over the last 2 years, requiring I&D on multiple occasions. He reports that this one is at the right gluteal cleft area but gets close to his anal opening. He describes the pain is 9/10 intermittent throbbing nonradiating right buttock pain around the area of the abscess, worse with sitting/walking/palpation of the area, and mildly improved with ibuprofen at home and Percocet given here. He reports that it hurts to have a BM because it pushes on his R buttock area, denies that he has focal anal or rectal pain with BMs. He reports associated erythema and induration of the area. He denies any warmth, left ribs, drainage, or red streaking. He does not use IV drugs, denies any known insect bite or injury to the area, states that these happen spontaneously and he is not exactly sure why they keep recurring. He denies history of immunocompromising conditions or medications, does not currently have a PCP. He denies any fevers, chills, CP, SOB, abdominal pain, nausea, vomiting, diarrhea, constipation, rectal pain, mucus in his stools, melena, hematochezia, dysuria, hematuria, arthralgias, numbness, tingling, focal weakness, or any other complaints at this time. Otherwise healthy individual. NPO since last night. NKDA.    The history is provided by the patient and medical records. No language interpreter was used.  Abscess  Location:  Pelvis Pelvic abscess location:  R buttock Abscess quality: induration, painful and redness    Abscess quality: not draining, no fluctuance and no warmth   Red streaking: no   Duration:  1 day Progression:  Worsening Pain details:    Quality:  Throbbing   Severity:  Moderate   Duration:  1 day   Timing:  Constant   Progression:  Worsening Chronicity:  Recurrent Context: not diabetes, not immunosuppression, not injected drug use, not insect bite/sting and not skin injury   Relieved by:  NSAIDs (and percocet here) Exacerbated by: sitting or walking. Ineffective treatments:  None tried Associated symptoms: no fever, no nausea and no vomiting   Risk factors: prior abscess     Past Medical History:  Diagnosis Date  . Abscess   . Bronchitis     There are no active problems to display for this patient.   History reviewed. No pertinent surgical history.     Home Medications    Prior to Admission medications   Medication Sig Start Date End Date Taking? Authorizing Provider  acetaminophen (TYLENOL) 500 MG tablet Take 500 mg by mouth every 6 (six) hours as needed for mild pain.   Yes [provider]  cephALEXin (KEFLEX) 500 MG capsule 2 caps po bid x 7 days 09/23/16   Arthor CaptainHarris, Abigail, PA-C  cyclobenzaprine (FLEXERIL) 10 MG tablet Take 1 tablet (10 mg total) by mouth at bedtime. 04/16/16   Bethel BornGekas, Kelly Marie, PA-C  diclofenac (VOLTAREN) 50 MG EC tablet Take 1 tablet (50 mg total) by mouth 2 (two) times daily. Patient not taking: Reported on 03/20/2017 07/26/16   Janne NapoleonNeese, Hope M, NP  HYDROcodone-acetaminophen [facility](NORCO) [phone]  MG tablet Take 1-2 tablets by mouth every 6 (six) hours as needed for severe pain. Patient not taking: Reported on 03/20/2017 09/23/16   Arthor Captain, PA-C  ibuprofen (ADVIL,MOTRIN) 600 MG tablet Take 1 tablet (600 mg total) by mouth 3 (three) times daily. Patient not taking: Reported on 03/20/2017 04/16/16   Bethel Born, PA-C  lidocaine (LIDODERM) 5 % Place 1 patch onto the skin daily. Remove & Discard patch within 12 hours or as directed by  MD Patient not taking: Reported on 03/20/2017 12/29/15   Arthor Captain, PA-C  meloxicam (MOBIC) 15 MG tablet Take 1 tablet (15 mg total) by mouth daily. Patient not taking: Reported on 03/20/2017 02/18/17   Arthor Captain, PA-C  methylPREDNISolone (MEDROL DOSEPAK) 4 MG TBPK tablet Use as directed Patient not taking: Reported on 03/20/2017 02/18/17   Arthor Captain, PA-C  naproxen (NAPROSYN) 500 MG tablet Take 1 tablet (500 mg total) by mouth 2 (two) times daily with a meal. Patient not taking: Reported on 03/20/2017 12/29/15   Arthor Captain, PA-C    Family History No family history on file.  Social History Social History  Substance Use Topics  . Smoking status: Current Every Day Smoker    Packs/day: 1.00    Types: Cigarettes  . Smokeless tobacco: Never Used  . Alcohol use Yes     Comment: occasionally     Allergies   Banana and Tomato   Review of Systems Review of Systems  Constitutional: Negative for chills and fever.  Respiratory: Negative for shortness of breath.   Cardiovascular: Negative for chest pain.  Gastrointestinal: Negative for abdominal pain, blood in stool, constipation, diarrhea, nausea, rectal pain and vomiting.  Genitourinary: Negative for dysuria and hematuria.  Musculoskeletal: Positive for myalgias (R buttock). Negative for arthralgias.  Skin: Positive for color change and wound ("boil" to R buttock).  Allergic/Immunologic: Negative for immunocompromised state.  Neurological: Negative for weakness and numbness.  Psychiatric/Behavioral: Negative for confusion.   All other systems reviewed and are negative for acute change except as noted in the HPI.    Physical Exam Updated Vital Signs BP 116/70   Pulse 84   Temp 98.2 F (36.8 C) (Oral)   Resp 17   Ht 6\' 2"  (1.88 m)   Wt 61.2 kg (135 lb)   SpO2 97%   BMI 17.33 kg/m   Physical Exam  Constitutional: He is oriented to person, place, and time. Vital signs are normal. He appears well-developed  and well-nourished.  Non-toxic appearance. No distress.  Afebrile, nontoxic, NAD  HENT:  Head: Normocephalic and atraumatic.  Mouth/Throat: Oropharynx is clear and moist and mucous membranes are normal.  Eyes: Conjunctivae and EOM are normal. Right eye exhibits no discharge. Left eye exhibits no discharge.  Neck: Normal range of motion. Neck supple.  Cardiovascular: Normal rate, regular rhythm, normal heart sounds and intact distal pulses.  Exam reveals no gallop and no friction rub.   No murmur heard. Pulmonary/Chest: Effort normal and breath sounds normal. No respiratory distress. He has no decreased breath sounds. He has no wheezes. He has no rhonchi. He has no rales.  Abdominal: Soft. Normal appearance and bowel sounds are normal. He exhibits no distension. There is no tenderness. There is no rigidity, no rebound, no guarding, no CVA tenderness, no tenderness at McBurney's point and negative Murphy's sign.  Soft, NTND, +BS throughout, no r/g/r, neg murphy's, neg mcburney's, no CVA TTP   Genitourinary: Rectal exam shows no external hemorrhoid, no fissure, no tenderness and  anal tone normal.     Genitourinary Comments: Chaperone present R buttock with ~6cm indurated abscess to the gluteal fold margin which extends to the anal margin however does not seem to connect into the anus, with central fluctuance, slight erythema and warmth, no drainage. DRE limited due to pt discomfort with separating the buttocks, so unable to perform full rectal exam, but palpation of the anal opening and anal sphincter does not reproduce any tenderness, and doesn't appear to have any induration in the anus, just adjacent to this area. Normal tone, no fissures, no ext hemorrhoids. Did not perform FOBT testing, and did not evaluate prostate.   Musculoskeletal: Normal range of motion.  Neurological: He is alert and oriented to person, place, and time. He has normal strength. No sensory deficit.  Skin: Skin is warm, dry  and intact. No rash noted. There is erythema.  R buttock abscess as mentioned above  Psychiatric: He has a normal mood and affect.  Nursing note and vitals reviewed.    ED Treatments / Results  Labs (all labs ordered are listed, but only abnormal results are displayed) Labs Reviewed  CBC WITH DIFFERENTIAL/PLATELET - Abnormal; Notable for the following:       Result Value   WBC 13.0 (*)    Neutro Abs 9.8 (*)    Monocytes Absolute 1.6 (*)    All other components within normal limits  BASIC METABOLIC PANEL - Abnormal; Notable for the following:    Glucose, Bld 106 (*)    BUN 5 (*)    All other components within normal limits    EKG  EKG Interpretation None       Radiology Ct Pelvis W Contrast  Result Date: 03/20/2017 CLINICAL DATA:  CT pelvis with contrast due to abscess of anal and rectal region To ED for eval of abscess to right buttocks since last pm. States he has them frequently. No drainage. Pt unable to sit due to pain. Tearful EXAM: CT PELVIS WITH CONTRAST TECHNIQUE: Multidetector CT imaging of the pelvis was performed using the standard protocol following the bolus administration of intravenous contrast. CONTRAST:  ISOVUE-300 IOPAMIDOL (ISOVUE-300) INJECTION 61% COMPARISON:  None. FINDINGS: Urinary Tract:  Distal ureters and bladder normal Bowel:  Limited view of the bowel is unremarkable Vascular/Lymphatic: No pelvic lymphadenopathy Reproductive:  Prostate normal Other: There is an abscess within the midline subcutaneous tissue of the RIGHT gluteal cleft measuring 5.1 x 2.7 cm by a 4.9 cm (image 44, series 3). Presumable perianal fistula which is poorly demonstrated by CT. No evidence of extension into the supra sphincteric tissues. Neck gas in the perineum.  No osseous abnormality. Musculoskeletal: No osseous abnormality IMPRESSION: 1. 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 2. No CT evidence of supra  sphincteric extension into the pelvis. Electronically Signed   By: Genevive Bi M.D.   On: 03/20/2017 14:54    Procedures Procedures (including critical care time)  Medications Ordered in ED Medications  oxyCODONE-acetaminophen (PERCOCET/ROXICET) 5-325 MG per tablet 1 tablet (1 tablet Oral Given 03/20/17 0951)  morphine 4 MG/ML injection 4 mg (4 mg Intravenous Given 03/20/17 1311)  iopamidol (ISOVUE-300) 61 % injection (100 mLs  Contrast Given 03/20/17 1416)  ondansetron (ZOFRAN) injection 4 mg (4 mg Intravenous Given 03/20/17 1353)  morphine 4 MG/ML injection 4 mg (4 mg Intravenous Given 03/20/17 1508)     Initial Impression / Assessment and Plan / ED Course  I have  reviewed the triage vital signs and the nursing notes.  Pertinent labs & imaging results that were available during my care of the patient were reviewed by me and considered in my medical decision making (see chart for details).     31 y.o. male here with R buttock abscess that began yesterday; has had recurrent abscesses in this area before, which have required I&D in the ED. On exam, ~6cm indurated abscess with central fluctuance at the R gluteal cleft which extends towards the anal margin but doesn't definitely connect into the rectum/anus, exquisitely tender, some slight warmth and erythema, no drainage. Unable to perform full DRE due to pt discomfort with separating the buttocks, however doesn't have any tenderness directly at the anus/anal sphincter. 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 Will give pain meds and get basic labs, then reassess after CT imaging.   3:02 PM CBC w/diff with mild neutrophilic leukocytosis. BMP WNL. CT pelvis confirms that there is a perianal fistula connecting it to the large abscess in the R gluteal  fold. Given repeat I&Ds in the past with recurrent abscess formation, now with fistula tract formation, and level of pain limited exam here, I will consult surgery team to see if this would be better managed in the operating room/EUA. I'm not confident that I&D in the ER would be adequate, due to pain being a limiting factor. Will discuss with surgery. Pt still with pain, will give more pain medication. Discussed case with my attending Dr. Denton Lank who agrees with plan.   4:27 PM Dr. Cliffton Asters of surgery returning page and will be down to evaluate the pt. Will await further instruction.  4:48 PM Dr. Cliffton Asters down to see pt and will admit. Holding orders to be placed by admitting team. Please see their notes for further documentation of care. I appreciate their help with this pleasant pt's care. Pt stable at time of admission.     Final Clinical Impressions(s) / ED Diagnoses   Final diagnoses:  Perianal fistula  Abscess of buttock, right  Neutrophilic leukocytosis    New Prescriptions New Prescriptions   No medications on [address], [place], [place] 03/20/17 1649    Cathren Laine, MD 03/21/17 1414

## 2017-03-20 NOTE — Anesthesia Procedure Notes (Signed)
Procedure Name: Intubation Date/Time: 03/20/2017 6:21 PM Performed by: Teressa Lower Pre-anesthesia Checklist: Patient identified, Emergency Drugs available, Suction available and Patient being monitored Patient Re-evaluated:Patient Re-evaluated prior to induction Oxygen Delivery Method: Circle system utilized Preoxygenation: Pre-oxygenation with 100% oxygen Induction Type: IV induction Ventilation: Mask ventilation without difficulty Laryngoscope Size: Mac and 4 Grade View: Grade I Tube type: Oral Tube size: 7.5 mm Number of attempts: 1 Airway Equipment and Method: Stylet and Oral airway Placement Confirmation: ETT inserted through vocal cords under direct vision,  positive ETCO2 and breath sounds checked- equal and bilateral Secured at: 24 cm Tube secured with: Tape Dental Injury: Teeth and Oropharynx as per pre-operative assessment

## 2017-03-20 NOTE — Op Note (Signed)
03/20/2017  7:04 PM  PATIENT:  Ronnie Hoover  31 y.o. male  Patient Care Team: Patient, No Pcp Per as PCP - General (General Practice)  PRE-OPERATIVE DIAGNOSIS:  Right buttock abscess  POST-OPERATIVE DIAGNOSIS:  Same  PROCEDURE:   1. Exam under anesthesia 2. Incision and drainage of right buttock abscess with placement of penrose drain  SURGEON:  Surgeon(s): Andria MeuseWhite, Jeannett Dekoning M, MD  ASSISTANT: none   ANESTHESIA:   general  SPECIMEN/CULTURES: Cultures of abscess fluid  COUNTS: Sponge, needle and instrument counts were reported correct x2 at conclusion of the procedure  PLAN OF CARE: Ward for observation; removing packing in AM; no need to continue packing  INDICATION: Ronnie QuaWilliam T Penado is an 31 y.o. male who is here for right buttock pain x2d. 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 +chills. No fevers. No active drainage at this time, just exquisite pain. Normal BMs. No weight changes. Denies hx of black tarry stool or blood in stool. There was fluctuance in the right buttock. CT showed a large perianal abscess. The anatomy and physiology of the anus and rectum were explained and diagrams utilized. The pathophysiology was elaborated. The procedure, material risk (including but not limited to pain, bleeding, infection, scarring, need for additional procedures, need for blood transfusion, incontinence of gas or feces, injury to surrounding structures, heart attack, stroke, death), benefits and alternatives were explained. The patient's questions were answered to their satisfaction and they elected to proceed with surgery.  OR FINDINGS: Large cavity with ~50cc of pus under pressure. Despite extensive evaluation of the anal canal, no clear internal opening could be identified. The cavity tracked deep and medially toward the anus but did not involve sphincter muscle. A counter incision was made medially and a penrose placed to  facilite drainage  DESCRIPTION: The patient was identified in the preoperative holding area and taken to the OR. SCDs were placed.  General endotracheal anesthesia was induced without difficulty. The patient was then carefully rolled onto the OR table and positioned in prone jackknife position with buttocks gently taped apart.  The patient was then prepped and draped in usual sterile fashion.  A surgical timeout was performed indicating the correct patient, procedure, positioning and need for preoperative antibiotics.   I began with a digital rectal exam which was normal.   I then placed a Hill-Ferguson anoscope into the anal canal and evaluated this completely.  No internal opening could be demonstrated. An incision was made over the most fluctuant area and pus exuded from the wound. This was sent for cultures. The wound was explored and landscaped to facilitate drainage. A counter incision was made more medially closer to the anus but no sphincter muscle was involved in the cavity. A small penrose drain was then looped through to facilitate drainage and secured with a silk suture. The wound was irrigated and hemostasis verified. The wound was packed with a single 4x4 and covered with a clean dressing. The patient was then awakened and transferred to a stretcher for transport to PACU in satisfactory condition.

## 2017-03-20 NOTE — ED Notes (Signed)
Patient transported to CT 

## 2017-03-20 NOTE — Transfer of Care (Signed)
Immediate Anesthesia Transfer of Care Note  Patient: Ronnie Hoover  Procedure(s) Performed: IRRIGATION AND DEBRIDEMENT ABSCESS , EUA, DRAIN PLACEMENT (Right )  Patient Location: PACU  Anesthesia Type:General  Level of Consciousness: awake, alert  and oriented  Airway & Oxygen Therapy: Patient Spontanous Breathing and Patient connected to face mask oxygen  Post-op Assessment: Report given to RN and Post -op Vital signs reviewed and stable  Post vital signs: Reviewed and stable  Last Vitals:  Vitals:   03/20/17 1630 03/20/17 1700  BP: 110/68 (!) 104/54  Pulse: 84 83  Resp:    Temp:    SpO2:  95%    Last Pain:  Vitals:   03/20/17 1536  TempSrc:   PainSc: 0-No pain         Complications: No apparent anesthesia complications

## 2017-03-20 NOTE — ED Notes (Signed)
General surgery MD at bedside.

## 2017-03-20 NOTE — Discharge Instructions (Addendum)
TAKE TYLENOL AND IBUPROFEN FOR PAIN, TAKE OXYCODONE ONLY IF YOU REALLY NEED IT. DO NOT DRIVE IF TAKING OXYCODONE. COMPLETE ONE WEEK OF ANTIBIOTIC TREATMENT.  FOLLOW UP WITH DR. WHITE.  How to Take a Sitz Bath A sitz bath is a warm water bath that is taken while you are sitting down. The water should only come up to your hips and should cover your buttocks. Your health care provider may recommend a sitz bath to help you:  Clean the lower part of your body, including your genital area.  With itching.  With pain.  With sore muscles or muscles that tighten or spasm.  How to take a sitz bath Take 3-4 sitz baths per day or as told by your health care provider. 1. Partially fill a bathtub with warm water. You will only need the water to be deep enough to cover your hips and buttocks when you are sitting in it. 2. If your health care provider told you to put medicine in the water, follow the directions exactly. 3. Sit in the water and open the tub drain a little. 4. Turn on the warm water again to keep the tub at the correct level. Keep the water running constantly. 5. Soak in the water for 15-20 minutes or as told by your health care provider. 6. After the sitz bath, pat the affected area dry first. Do not rub it. 7. Be careful when you stand up after the sitz bath because you may feel dizzy.  Contact a health care provider if:  Your symptoms get worse. Do not continue with sitz baths if your symptoms get worse.  You have new symptoms. Do not continue with sitz baths until you talk with your health care provider. This information is not intended to replace advice given to you by your health care provider. Make sure you discuss any questions you have with your health care provider. Document Released: 02/03/2004 Document Revised: 10/11/2015 Document Reviewed: 05/11/2014 Elsevier Interactive Patient Education  [employer]2018 [employer]

## 2017-03-20 NOTE — H&P (Signed)
CC: Perianal abscess  HPI: Ronnie Hoover is an 31 y.o. male who is here for right buttock pain x2d. 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 +chills. No fevers. No active drainage at this time, just exquisite pain. Normal BMs. No weight changes. Denies hx of black tarry stool or blood in stool.  PMH: Perianal abscesses  PSH: Tooth extraction  FHx: Denies family hx of cancer; denies fhx of IBD  Past Medical History:  Diagnosis Date  . Abscess   . Bronchitis     History reviewed. No pertinent surgical history.  No family history on file.  Social:  reports that he has been smoking Cigarettes.  He has been smoking about 1.00 pack per day. He has never used smokeless tobacco. He reports that he drinks alcohol. He reports that he does not use drugs.  Allergies:  Allergies  Allergen Reactions  . Banana Anaphylaxis and Nausea And Vomiting  . Tomato Anaphylaxis and Hives    Medications: I have reviewed the patient's current medications.  Results for orders placed or performed during the hospital encounter of 03/20/17 (from the past 48 hour(s))  CBC with Differential     Status: Abnormal   Collection Time: 03/20/17  1:05 PM  Result Value Ref Range   WBC 13.0 (H) 4.0 - 10.5 K/uL   RBC 4.81 4.22 - 5.81 MIL/uL   Hemoglobin 16.3 13.0 - 17.0 g/dL   HCT 46.7 39.0 - 52.0 %   MCV 97.1 78.0 - 100.0 fL   MCH 33.9 26.0 - 34.0 pg   MCHC 34.9 30.0 - 36.0 g/dL   RDW 12.7 11.5 - 15.5 %   Platelets 238 150 - 400 K/uL   Neutrophils Relative % 75 %   Neutro Abs 9.8 (H) 1.7 - 7.7 K/uL   Lymphocytes Relative 11 %   Lymphs Abs 1.5 0.7 - 4.0 K/uL   Monocytes Relative 13 %   Monocytes Absolute 1.6 (H) 0.1 - 1.0 K/uL   Eosinophils Relative 1 %   Eosinophils Absolute 0.1 0.0 - 0.7 K/uL   Basophils Relative 0 %   Basophils Absolute 0.0 0.0 - 0.1 K/uL  Basic metabolic panel     Status: Abnormal   Collection Time: 03/20/17  1:05 PM  Result  Value Ref Range   Sodium 138 135 - 145 mmol/L   Potassium 3.8 3.5 - 5.1 mmol/L   Chloride 106 101 - 111 mmol/L   CO2 24 22 - 32 mmol/L   Glucose, Bld 106 (H) 65 - 99 mg/dL   BUN 5 (L) 6 - 20 mg/dL   Creatinine, Ser 1.01 0.61 - 1.24 mg/dL   Calcium 9.3 8.9 - 10.3 mg/dL   GFR calc non Af Amer >60 >60 mL/min   GFR calc Af Amer >60 >60 mL/min    Comment: (NOTE) The eGFR has been calculated using the CKD EPI equation. This calculation has not been validated in all clinical situations. eGFR's persistently <60 mL/min signify possible Chronic Kidney Disease.    Anion gap 8 5 - 15    Ct Pelvis W Contrast  Result Date: 03/20/2017 CLINICAL DATA:  CT pelvis with contrast due to abscess of anal and rectal region To ED for eval of abscess to right buttocks since last pm. States he has them frequently. No drainage. Pt unable to sit due to pain. Tearful EXAM: CT PELVIS WITH CONTRAST TECHNIQUE: Multidetector CT imaging of the pelvis was performed using the standard protocol  following the bolus administration of intravenous contrast. CONTRAST:  139m ISOVUE-300 IOPAMIDOL (ISOVUE-300) INJECTION 61% COMPARISON:  None. FINDINGS: Urinary Tract:  Distal ureters and bladder normal Bowel:  Limited view of the bowel is unremarkable Vascular/Lymphatic: No pelvic lymphadenopathy Reproductive:  Prostate normal Other: There is an abscess within the midline subcutaneous tissue of the RIGHT gluteal cleft measuring 5.1 x 2.7 cm by a 4.9 cm (image 44, series 3). Presumable perianal fistula which is poorly demonstrated by CT. No evidence of extension into the supra sphincteric tissues. Neck gas in the perineum.  No osseous abnormality. Musculoskeletal: No osseous abnormality IMPRESSION: 1. 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 2. No CT evidence of supra sphincteric extension into the pelvis. Electronically Signed   By: SSuzy BouchardM.D.   On: 03/20/2017  14:54    ROS - all of the below systems have been reviewed with the patient and positives are indicated with bold text General: chills, fever or night sweats Eyes: blurry vision or double vision ENT: epistaxis or sore throat Allergy/Immunology: itchy/watery eyes or nasal congestion Hematologic/Lymphatic: bleeding problems, blood clots or swollen lymph nodes Endocrine: temperature intolerance or unexpected weight changes Breast: new or changing breast lumps or nipple discharge Resp: cough, shortness of breath, or wheezing CV: chest pain or dyspnea on exertion GI: as per HPI GU: dysuria, trouble voiding, or hematuria MSK: joint pain or joint stiffness Neuro: TIA or stroke symptoms Derm: pruritus and skin lesion changes Psych: anxiety and depression  PE Blood pressure 110/68, pulse 84, temperature 98.2 F (36.8 C), temperature source Oral, resp. rate 17, height '6\' 2"'$  (1.88 m), weight 61.2 kg (135 lb), SpO2 99 %. Gen: NAD, conversant Eyes: Moist conjunctiva, no lid lag, anicteric, PERRL Neck: Trachea midline; no thyromegaly CV: RRR; no palpable thrills; no pitting edema Pulm: Normal work of breathing; no tactile fremitus GI: soft, NT/ND; no palpable hepatosplenomegaly MSK: Normal gait; no clubbing/cyanosis Psych: approp affect; A&Ox3 Lymph: NO palpable cervical or axillary LAD Results for orders placed or performed during the hospital encounter of 03/20/17 (from the past 48 hour(s))  CBC with Differential     Status: Abnormal   Collection Time: 03/20/17  1:05 PM  Result Value Ref Range   WBC 13.0 (H) 4.0 - 10.5 K/uL   RBC 4.81 4.22 - 5.81 MIL/uL   Hemoglobin 16.3 13.0 - 17.0 g/dL   HCT 46.7 39.0 - 52.0 %   MCV 97.1 78.0 - 100.0 fL   MCH 33.9 26.0 - 34.0 pg   MCHC 34.9 30.0 - 36.0 g/dL   RDW 12.7 11.5 - 15.5 %   Platelets 238 150 - 400 K/uL   Neutrophils Relative % 75 %   Neutro Abs 9.8 (H) 1.7 - 7.7 K/uL   Lymphocytes Relative 11 %   Lymphs Abs 1.5 0.7 - 4.0 K/uL    Monocytes Relative 13 %   Monocytes Absolute 1.6 (H) 0.1 - 1.0 K/uL   Eosinophils Relative 1 %   Eosinophils Absolute 0.1 0.0 - 0.7 K/uL   Basophils Relative 0 %   Basophils Absolute 0.0 0.0 - 0.1 K/uL  Basic metabolic panel     Status: Abnormal   Collection Time: 03/20/17  1:05 PM  Result Value Ref Range   Sodium 138 135 - 145 mmol/L   Potassium 3.8 3.5 - 5.1 mmol/L   Chloride 106 101 - 111 mmol/L   CO2 24 22 - 32 mmol/L   Glucose, Bld  106 (H) 65 - 99 mg/dL   BUN 5 (L) 6 - 20 mg/dL   Creatinine, Ser 1.01 0.61 - 1.24 mg/dL   Calcium 9.3 8.9 - 10.3 mg/dL   GFR calc non Af Amer >60 >60 mL/min   GFR calc Af Amer >60 >60 mL/min    Comment: (NOTE) The eGFR has been calculated using the CKD EPI equation. This calculation has not been validated in all clinical situations. eGFR's persistently <60 mL/min signify possible Chronic Kidney Disease.    Anion gap 8 5 - 15    Ct Pelvis W Contrast  Result Date: 03/20/2017 CLINICAL DATA:  CT pelvis with contrast due to abscess of anal and rectal region To ED for eval of abscess to right buttocks since last pm. States he has them frequently. No drainage. Pt unable to sit due to pain. Tearful EXAM: CT PELVIS WITH CONTRAST TECHNIQUE: Multidetector CT imaging of the pelvis was performed using the standard protocol following the bolus administration of intravenous contrast. CONTRAST:  133m ISOVUE-300 IOPAMIDOL (ISOVUE-300) INJECTION 61% COMPARISON:  None. FINDINGS: Urinary Tract:  Distal ureters and bladder normal Bowel:  Limited view of the bowel is unremarkable Vascular/Lymphatic: No pelvic lymphadenopathy Reproductive:  Prostate normal Other: There is an abscess within the midline subcutaneous tissue of the RIGHT gluteal cleft measuring 5.1 x 2.7 cm by a 4.9 cm (image 44, series 3). Presumable perianal fistula which is poorly demonstrated by CT. No evidence of extension into the supra sphincteric tissues. Neck gas in the perineum.  No osseous  abnormality. Musculoskeletal: No osseous abnormality IMPRESSION: 1. 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 2. No CT evidence of supra sphincteric extension into the pelvis. Electronically Signed   By: SSuzy BouchardM.D.   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 -The planned procedure, material risks (including, but not limited to, pain, bleeding, infection, scarring, recurrence, need for additional procedures, damage to surrounding structures, fecal incontinence, need for transfusion, chronic pain) benefits and alternatives were discussed; his questions were answered to his satisfaction and he elected to proceed with surgery -NPO since yesterday -IV Zosyn  CSharon Mt WDema Severin M.D. General and Colorectal Surgery [facility]

## 2017-03-20 NOTE — ED Triage Notes (Addendum)
To ED for eval of abscess to right buttocks since last pm. States he has them frequently. No drainage. Pt unable to sit due to pain. tearful

## 2017-03-20 NOTE — Anesthesia Postprocedure Evaluation (Signed)
Anesthesia Post Note  Patient: Ronnie Hoover  Procedure(s) Performed: IRRIGATION AND DEBRIDEMENT ABSCESS , EUA, DRAIN PLACEMENT (Right )     Patient location during evaluation: PACU Anesthesia Type: General Level of consciousness: awake and alert Pain management: pain level controlled Vital Signs Assessment: post-procedure vital signs reviewed and stable Respiratory status: spontaneous breathing, nonlabored ventilation, respiratory function stable and patient connected to nasal cannula oxygen Cardiovascular status: blood pressure returned to baseline and stable Postop Assessment: no apparent nausea or vomiting Anesthetic complications: no    Last Vitals:  Vitals:   03/20/17 1700 03/20/17 1905  BP: (!) 104/54 117/85  Pulse: 83 (!) 103  Resp:  (!) 28  Temp:  36.8 C  SpO2: 95% 100%    Last Pain:  Vitals:   03/20/17 1905  TempSrc:   PainSc: 0-No pain                 Merci Walthers S

## 2017-03-21 ENCOUNTER — Encounter (HOSPITAL_COMMUNITY): Payer: Self-pay | Admitting: Surgery

## 2017-03-21 LAB — HIV ANTIBODY (ROUTINE TESTING W REFLEX): HIV Screen 4th Generation wRfx: NONREACTIVE

## 2017-03-21 MED ORDER — OXYCODONE HCL 5 MG PO TABS: 5.0000 mg | ORAL_TABLET | Freq: Four times a day (QID) | ORAL | 0 refills | Status: DC | PRN

## 2017-03-21 MED ORDER — AMOXICILLIN-POT CLAVULANATE 875-125 MG PO TABS: 1.0000 | ORAL_TABLET | Freq: Two times a day (BID) | ORAL | 0 refills | Status: AC

## 2017-03-21 MED ORDER — IBUPROFEN 600 MG PO TABS: 600.0000 mg | ORAL_TABLET | Freq: Three times a day (TID) | ORAL | 0 refills | Status: DC | PRN

## 2017-03-21 MED ORDER — ACETAMINOPHEN 500 MG PO TABS: 500.0000 mg | ORAL_TABLET | Freq: Four times a day (QID) | ORAL | 0 refills | Status: DC | PRN

## 2017-03-21 NOTE — Care Management Note (Signed)
Case Management Note  Patient Details  Name: Ronnie Hoover MRN: [id number] Date of Birth: 05-03-1986  Subjective/Objective:                    Action/Plan:   Expected Discharge Date:  03/21/17               Expected Discharge Plan:  Home/Self Care  In-House Referral:     Discharge planning Services  CM Consult, MATCH Program, Medication Assistance, Indigent Health Clinic  Post Acute Care Choice:    Choice offered to:  Patient  DME Arranged:    DME Agency:     HH Arranged:    HH Agency:     Status of Service:  Completed, signed off  If discussed at [employer] Length of Stay Meetings, dates discussed:    Additional Comments:  [name], [name] Marie, RN 03/21/2017, 12:06 PM

## 2017-03-21 NOTE — Discharge Summary (Signed)
[facility] Discharge Summary   Patient ID: Ronnie QuaWilliam T Stribling MRN: [id number] DOB/AGE: [birth year] 31 y.o.  Admit date: 03/20/2017 Discharge date: 03/21/2017   Discharge Diagnosis Patient Active Problem List   Diagnosis Date Noted  . Perianal abscess 03/20/2017   Imaging: Ct Pelvis W Contrast  Result Date: 03/20/2017 CLINICAL DATA:  CT pelvis with contrast due to abscess of anal and rectal region To ED for eval of abscess to right buttocks since last pm. States he has them frequently. No drainage. Pt unable to sit due to pain. Tearful EXAM: CT PELVIS WITH CONTRAST TECHNIQUE: Multidetector CT imaging of the pelvis was performed using the standard protocol following the bolus administration of intravenous contrast. CONTRAST:  100mL ISOVUE-300 IOPAMIDOL (ISOVUE-300) INJECTION 61% COMPARISON:  None. FINDINGS: Urinary Tract:  Distal ureters and bladder normal Bowel:  Limited view of the bowel is unremarkable Vascular/Lymphatic: No pelvic lymphadenopathy Reproductive:  Prostate normal Other: There is an abscess within the midline subcutaneous tissue of the RIGHT gluteal cleft measuring 5.1 x 2.7 cm by a 4.9 cm (image 44, series 3). Presumable perianal fistula which is poorly demonstrated by CT. No evidence of extension into the supra sphincteric tissues. Neck gas in the perineum.  No osseous abnormality. Musculoskeletal: No osseous abnormality IMPRESSION: 1. 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 2. No CT evidence of supra sphincteric extension into the pelvis. Electronically Signed   By: Genevive BiStewart  Edmunds M.D.   On: 03/20/2017 14:54    Procedures Dr. Marin Olphristopher White (03/20/17) - exam under general anesthesia, incision and drainage right buttock abscess, placement penrose drain  Hospital Course:  Ronnie Hoover is an 31 y.o. male with PMH multiple perianal abscesses requiring lancing in the ED [facility]  with right [facility]ock pain and chills x2d.  Exam concerning for perianal abscess with possible fistula and he was admitted for above procedure.  Patient was admitted, started on IV abx, and underwent procedure listed above. Intraoperative wound cultures were obtained. Tolerated procedure well and was transferred to the floor. Diet was advanced as tolerated.  On POD#1, the patient was voiding well, having bowel movements, tolerating diet, ambulating well, pain well controlled, vital signs stable, incisions c/d/i and felt stable for discharge home.  Patient will follow up in our office in 1 week as below and knows to call with questions or concerns.   Allergies as of 03/21/2017      Reactions   Banana Anaphylaxis, Nausea And Vomiting   Tomato Anaphylaxis, Hives      Medication List    STOP taking these medications   cephALEXin 500 MG capsule Commonly known as:  KEFLEX   diclofenac 50 MG EC tablet Commonly known as:  VOLTAREN   HYDROcodone-acetaminophen 5-325 MG tablet Commonly known as:  NORCO   lidocaine 5 % Commonly known as:  LIDODERM   meloxicam 15 MG tablet Commonly known as:  MOBIC   methylPREDNISolone 4 MG Tbpk tablet Commonly known as:  MEDROL DOSEPAK   naproxen 500 MG tablet Commonly known as:  NAPROSYN     TAKE these medications   acetaminophen 500 MG tablet Commonly known as:  TYLENOL Take 1-2 tablets (500-1,000 mg total) by mouth every 6 (six) hours as needed for mild pain. What changed:  how much to take   amoxicillin-clavulanate 875-125 MG tablet Commonly known as:  AUGMENTIN Take 1 tablet by mouth 2 (two) times daily.   cyclobenzaprine 10 MG tablet  Commonly known as:  FLEXERIL Take 1 tablet (10 mg total) by mouth at bedtime.   ibuprofen 600 MG tablet Commonly known as:  ADVIL,MOTRIN Take 1 tablet (600 mg total) by mouth every 8 (eight) hours as needed. What changed:  when to take this  reasons to take this   oxyCODONE 5 MG immediate release  tablet Commonly known as:  Oxy IR/ROXICODONE Take 1 tablet (5 mg total) by mouth every 6 (six) hours as needed for severe pain.        Follow-up Information    Andria Meuse, MD. Go on 03/31/2017.   Specialty:  General Surgery Why:  at 2:45 PM for follow up from recent surgical procedure. please arrive 30 minutes early to get checked in and fill out any necessary paperwork. Contact information: [address] [place] [address] [phone]        [facility]. Schedule an appointment as soon as possible for a visit.   Contact information: [address] [place] [address] [phone]          Signed: Hosie Spangle, [facility] 03/21/2017, 11:51 AM Pager: [phone] Consults: [phone] Mon-Fri 7:00 am-4:30 pm Sat-Sun 7:00 am-11:30 am

## 2017-03-21 NOTE — Progress Notes (Signed)
1 Day Post-Op   Subjective/Chief Complaint: Had a BM and RN changed dressing, less pain   Objective: Vital signs in last 24 hours: Temp:  [98.1 F (36.7 C)-98.7 F (37.1 C)] 98.7 F (37.1 C) (10/26 0957) Pulse Rate:  [54-103] 54 (10/26 0957) Resp:  [12-30] 18 (10/26 0147) BP: (96-130)/(54-85) 119/68 (10/26 0957) SpO2:  [91 %-100 %] 99 % (10/26 0957) Weight:  [61.2 kg (134 lb 14.7 oz)-61.2 kg (135 lb)] 61.2 kg (134 lb 14.7 oz) (10/25 2049) Last BM Date: 03/21/17  Intake/Output from previous day: 10/25 0701 - 10/26 0700 In: 800 [I.V.:800] Out: 5 [Blood:5] Intake/Output this shift: Total I/O In: 120 [P.O.:120] Out: -   General appearance: alert and cooperative GI: soft, perirectal wound packing removed, penrose in place, gauze placed and readherent dressing applied  Lab Results:   Recent Labs  03/20/17 1305  WBC 13.0*  HGB 16.3  HCT 46.7  PLT 238   BMET  Recent Labs  03/20/17 1305  NA 138  K 3.8  CL 106  CO2 24  GLUCOSE 106*  BUN 5*  CREATININE 1.01  CALCIUM 9.3   PT/INR No results for input(s): LABPROT, INR in the last 72 hours. ABG No results for input(s): PHART, HCO3 in the last 72 hours.  Invalid input(s): PCO2, PO2  Studies/Results: Ct Pelvis W Contrast  Result Date: 03/20/2017 CLINICAL DATA:  CT pelvis with contrast due to abscess of anal and rectal region To ED for eval of abscess to right buttocks since last pm. States he has them frequently. No drainage. Pt unable to sit due to pain. Tearful EXAM: CT PELVIS WITH CONTRAST TECHNIQUE: Multidetector CT imaging of the pelvis was performed using the standard protocol following the bolus administration of intravenous contrast. CONTRAST:  100mL ISOVUE-300 IOPAMIDOL (ISOVUE-300) INJECTION 61% COMPARISON:  None. FINDINGS: Urinary Tract:  Distal ureters and bladder normal Bowel:  Limited view of the bowel is unremarkable Vascular/Lymphatic: No pelvic lymphadenopathy Reproductive:  Prostate normal Other:  There is an abscess within the midline subcutaneous tissue of the RIGHT gluteal cleft measuring 5.1 x 2.7 cm by a 4.9 cm (image 44, series 3). Presumable perianal fistula which is poorly demonstrated by CT. No evidence of extension into the supra sphincteric tissues. Neck gas in the perineum.  No osseous abnormality. Musculoskeletal: No osseous abnormality IMPRESSION: 1. 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 2. No CT evidence of supra sphincteric extension into the pelvis. Electronically Signed   By: Genevive BiStewart  Edmunds M.D.   On: 03/20/2017 14:54    Anti-infectives: Anti-infectives    None      Assessment/Plan: Home with sitz baths and Augmentin. F/U with Dr. Cliffton AstersWhite  LOS: 0 days    Violeta GelinasHOMPSON,Kathrina Crosley E 03/21/2017

## 2017-03-27 LAB — AEROBIC/ANAEROBIC CULTURE W GRAM STAIN (SURGICAL/DEEP WOUND)

## 2017-04-08 ENCOUNTER — Ambulatory Visit (INDEPENDENT_AMBULATORY_CARE_PROVIDER_SITE_OTHER): Payer: Self-pay | Admitting: Physician Assistant

## 2017-04-08 ENCOUNTER — Encounter (INDEPENDENT_AMBULATORY_CARE_PROVIDER_SITE_OTHER): Payer: Self-pay | Admitting: Physician Assistant

## 2017-04-08 VITALS — BP 108/69 | HR 65 | Temp 97.9°F | Resp 18 | Ht 72.0 in | Wt 134.0 lb

## 2017-04-08 DIAGNOSIS — R61 Generalized hyperhidrosis: Secondary | ICD-10-CM

## 2017-04-08 DIAGNOSIS — L0231 Cutaneous abscess of buttock: Secondary | ICD-10-CM

## 2017-04-08 DIAGNOSIS — Z131 Encounter for screening for diabetes mellitus: Secondary | ICD-10-CM

## 2017-04-08 LAB — POCT GLYCOSYLATED HEMOGLOBIN (HGB A1C): Hemoglobin A1C: 5.8

## 2017-04-08 MED ORDER — AMOXICILLIN-POT CLAVULANATE 875-125 MG PO TABS: 1.0000 | ORAL_TABLET | Freq: Two times a day (BID) | ORAL | 0 refills | Status: DC

## 2017-04-08 NOTE — Patient Instructions (Signed)
Skin Abscess A skin abscess is an infected area on or under your skin that contains pus and other material. An abscess can happen almost anywhere on your body. Some abscesses break open (rupture) on their own. Most continue to get worse unless they are treated. The infection can spread deeper into the body and into your blood, which can make you feel sick. Treatment usually involves draining the abscess. Follow these instructions at home: Abscess Care  If you have an abscess that has not drained, place a warm, clean, wet washcloth over the abscess several times a day. Do this as told by your doctor.  Follow instructions from your doctor about how to take care of your abscess. Make sure you: ? Cover the abscess with a bandage (dressing). ? Change your bandage or gauze as told by your doctor. ? Wash your hands with soap and water before you change the bandage or gauze. If you cannot use soap and water, use hand sanitizer.  Check your abscess every day for signs that the infection is getting worse. Check for: ? More redness, swelling, or pain. ? More fluid or blood. ? Warmth. ? More pus or a bad smell. Medicines   Take over-the-counter and prescription medicines only as told by your doctor.  If you were prescribed an antibiotic medicine, take it as told by your doctor. Do not stop taking the antibiotic even if you start to feel better. General instructions  To avoid spreading the infection: ? Do not share personal care items, towels, or hot tubs with others. ? Avoid making skin-to-skin contact with other people.  Keep all follow-up visits as told by your doctor. This is important. Contact a doctor if:  You have more redness, swelling, or pain around your abscess.  You have more fluid or blood coming from your abscess.  Your abscess feels warm when you touch it.  You have more pus or a bad smell coming from your abscess.  You have a fever.  Your muscles ache.  You have  chills.  You feel sick. Get help right away if:  You have very bad (severe) pain.  You see red streaks on your skin spreading away from the abscess. This information is not intended to replace advice given to you by your health care provider. Make sure you discuss any questions you have with your health care provider. Document Released: 10/30/2007 Document Revised: 01/07/2016 Document Reviewed: 03/22/2015 Elsevier Interactive Patient Education  2018 [employer]  

## 2017-04-08 NOTE — Progress Notes (Signed)
Subjective:  Patient ID: Ronnie Hoover, male    DOB: 1985/06/22  Age: 31 y.o. MRN: [id number]  CC: establish care   HPI Ronnie Hoover is a 31 y.o. male with a medical history of perianal fistula presents as a new patient with complaint of gluteal pain. He was found to have a perianal fistula vs abscess. He is not sure which it is. He is managed by general surgery for his perianal fistula vs abscess. This is unclear as there is no conclusive evidence of fistula on the available notes. Pt reports that surgeon is leaning towards a diagnosis of abscess but is not sure if a tract is formed. Last visit 5 days ago with general surgery and was advised to keep penrose drain in. Pt does not know if there is going to be any further surgical procedure. Still taking Augmentin and has two pills left. Continues with pain and has run out of oxycodone. Surgeon reportedly recommended for patient to take Ibuprofen. Ibuprofen did not relieve pain. Pt endorses night sweats. Pt does not endorse f/c/n/v, abdominal pain, diarrhea, constipation, BRBPR, CP, palpitations, SOB, or HA.       Outpatient Medications Prior to Visit  Medication Sig Dispense Refill  . acetaminophen (TYLENOL) 500 MG tablet Take 1-2 tablets (500-1,000 mg total) by mouth every 6 (six) hours as needed for mild pain. 30 tablet 0  . cyclobenzaprine (FLEXERIL) 10 MG tablet Take 1 tablet (10 mg total) by mouth at bedtime. 20 tablet 0  . ibuprofen (ADVIL,MOTRIN) 600 MG tablet Take 1 tablet (600 mg total) by mouth every 8 (eight) hours as needed. 30 tablet 0  . oxyCODONE (OXY IR/ROXICODONE) 5 MG immediate release tablet Take 1 tablet (5 mg total) by mouth every 6 (six) hours as needed for severe pain. (Patient not taking: Reported on 04/08/2017) 7 tablet 0   No facility-administered medications prior to visit.      ROS Review of Systems  Constitutional: Negative for chills, fever and malaise/fatigue.  Eyes: Negative for blurred vision.   Respiratory: Negative for shortness of breath.   Cardiovascular: Negative for chest pain and palpitations.  Gastrointestinal: Negative for abdominal pain, blood in stool, constipation, diarrhea, melena, nausea and vomiting.  Genitourinary: Negative for dysuria and hematuria.  Musculoskeletal: Negative for joint pain and myalgias.  Skin: Negative for rash.       Gluteal pain from abscess  Neurological: Negative for tingling and headaches.  Psychiatric/Behavioral: Negative for depression. The patient is not nervous/anxious.     Objective:  BP 108/69 (BP Location: Left Arm, Patient Position: Sitting, Cuff Size: Normal)   Pulse 65   Temp 97.9 F (36.6 C) (Oral)   Resp 18   Ht 6' (1.829 m)   Wt 134 lb (60.8 kg)   SpO2 95%   BMI 18.17 kg/m   BP/Weight 04/08/2017 03/21/2017 03/20/2017  Systolic BP 108 119 -  Diastolic BP 69 68 -  Wt. (Lbs) 134 - 134.92  BMI 18.17 - 17.8      Physical Exam  Constitutional: He is oriented to person, place, and time.  Well developed, thin, NAD, polite  HENT:  Head: Normocephalic and atraumatic.  Eyes: No scleral icterus.  Cardiovascular: Normal rate, regular rhythm and normal heart sounds.  Pulmonary/Chest: Effort normal and breath sounds normal.  Abdominal: Soft. Bowel sounds are normal. There is no tenderness.  Musculoskeletal: He exhibits no edema.  Neurological: He is alert and oriented to person, place, and time.  Skin: Skin is warm  and dry. No rash noted. No erythema. No pallor.  Open wound on superior aspect of the right gluteus maximus, containing penrose drain. No sign of cellulitis, induration, fecal seepage, pustular suppuration, or bleeding.  Psychiatric: He has a normal mood and affect. His behavior is normal. Thought content normal.  Vitals reviewed.    Assessment & Plan:   1. Gluteal abscess - Currently managed by general surgery. Advised to keep his appointments with surgeon. - Refill Augmentin. - CBC with  Differential - Hepatic Function Panel  2. Night sweats - May likely have been attributed to abscess. - CBC with Differential - Hepatic Function Panel  3. Screening for diabetes mellitus - HgB A1c 5.8% in clinic today - Counseled on reducing consumption of sweets and carbs.  Meds ordered this encounter  Medications  . amoxicillin-clavulanate (AUGMENTIN) 875-125 MG tablet    Sig: Take 1 tablet 2 (two) times daily by mouth.    Dispense:  20 tablet    Refill:  0    Order Specific Question:   Supervising Provider    Answer:   Quentin AngstJEGEDE, OLUGBEMIGA E [[id number]]    Follow-up: Return in about 4 weeks (around 05/06/2017) for full physical.   Loletta Specteroger David Gomez PA

## 2017-04-09 ENCOUNTER — Telehealth (INDEPENDENT_AMBULATORY_CARE_PROVIDER_SITE_OTHER): Payer: Self-pay

## 2017-04-09 LAB — CBC WITH DIFFERENTIAL/PLATELET
BASOS: 1 %
Basophils Absolute: 0.1 10*3/uL (ref 0.0–0.2)
EOS (ABSOLUTE): 0.4 10*3/uL (ref 0.0–0.4)
EOS: 7 %
HEMATOCRIT: 47.3 % (ref 37.5–51.0)
Hemoglobin: 15.9 g/dL (ref 13.0–17.7)
Immature Grans (Abs): 0 10*3/uL (ref 0.0–0.1)
Immature Granulocytes: 0 %
LYMPHS ABS: 3.2 10*3/uL — AB (ref 0.7–3.1)
Lymphs: 52 %
MCH: 32.7 pg (ref 26.6–33.0)
MCHC: 33.6 g/dL (ref 31.5–35.7)
MCV: 97 fL (ref 79–97)
MONOS ABS: 0.5 10*3/uL (ref 0.1–0.9)
Monocytes: 9 %
Neutrophils Absolute: 1.9 10*3/uL (ref 1.4–7.0)
Neutrophils: 31 %
Platelets: 336 10*3/uL (ref 150–379)
RBC: 4.86 x10E6/uL (ref 4.14–5.80)
RDW: 13.9 % (ref 12.3–15.4)
WBC: 6 10*3/uL (ref 3.4–10.8)

## 2017-04-09 LAB — HEPATIC FUNCTION PANEL
ALBUMIN: 4.9 g/dL (ref 3.5–5.5)
ALK PHOS: 56 IU/L (ref 39–117)
ALT: 10 IU/L (ref 0–44)
AST: 15 IU/L (ref 0–40)
BILIRUBIN TOTAL: 1 mg/dL (ref 0.0–1.2)
BILIRUBIN, DIRECT: 0.18 mg/dL (ref 0.00–0.40)
TOTAL PROTEIN: 8.1 g/dL (ref 6.0–8.5)

## 2017-04-09 NOTE — Telephone Encounter (Signed)
-----   Message from Loletta Specteroger David Gomez, PA-C sent at 04/09/2017  1:44 PM EST ----- WBC and LFTs are back to normal.

## 2017-04-09 NOTE — Telephone Encounter (Signed)
Patient aware of LFT and WBC being back to normal. Maryjean Mornempestt S Roberts, CMA

## 2017-05-07 ENCOUNTER — Ambulatory Visit (INDEPENDENT_AMBULATORY_CARE_PROVIDER_SITE_OTHER): Payer: Self-pay | Admitting: Physician Assistant

## 2017-09-28 ENCOUNTER — Encounter (HOSPITAL_COMMUNITY): Payer: Self-pay | Admitting: Emergency Medicine

## 2017-09-28 ENCOUNTER — Emergency Department (HOSPITAL_COMMUNITY)
Admission: EM | Admit: 2017-09-28 | Discharge: 2017-09-28 | Disposition: A | Discharge status: Home / Self Care | Payer: Self-pay | Attending: Emergency Medicine | Admitting: Emergency Medicine

## 2017-09-28 DIAGNOSIS — F17210 Nicotine dependence, cigarettes, uncomplicated: Secondary | ICD-10-CM | POA: Insufficient documentation

## 2017-09-28 DIAGNOSIS — M25531 Pain in right wrist: Secondary | ICD-10-CM | POA: Insufficient documentation

## 2017-09-28 DIAGNOSIS — Z79899 Other long term (current) drug therapy: Secondary | ICD-10-CM | POA: Insufficient documentation

## 2017-09-28 NOTE — Progress Notes (Signed)
Orthopedic Tech Progress Note Patient Details:  Ronnie Hoover [birth year] [id number]  Ortho Devices Type of Ortho Device: Velcro wrist forearm splint       Saul Fordyce 09/28/2017, 2:35 PM

## 2017-09-28 NOTE — ED Triage Notes (Addendum)
Pt reports previous right fracture of right hand, having continued pain. States he lifts a lot of heavy boxes for work. No recent injuries. Has taken ibuprofen, used ice and heat with no relief.

## 2017-09-28 NOTE — Discharge Instructions (Addendum)
Please read instructions below. Apply ice to your wrist for 20 minutes at a time. You can take advil/ibuprofen every 6 hours as needed for pain. Wear the splint for support. Schedule an appointment with your primary care provider to follow-up if symptoms persist. Return to the ER for new or concerning symptoms.

## 2017-09-28 NOTE — ED Provider Notes (Signed)
[facility] EMERGENCY DEPARTMENT Provider Note   CSN: [id number] Arrival date & time: 09/28/17  1139     History   Chief Complaint Chief Complaint  Patient presents with  . Wrist Pain    HPI Ronnie Hoover is a 32 y.o. male presenting to the ED with right wrist pain.  Patient states he has a new job and has been lifting heavy boxes.  He localizes pain to the medial aspect of the wrist, that occurs when lifting and using his wrist.  No known injury.  Has been applying ice and heat and taking ibuprofen for symptoms.  Does report history of right hand fracture in 2005, however states his hand is not been bothering him.  Per chart review, patient with fractures at the bases of the second, third, fourth, and fifth metacarpals and May 2005.  Patient states he does not remember who his orthopedic specialist was, however states he did not have any problems with recovery.    The history is provided by the patient.    Past Medical History:  Diagnosis Date  . Abscess   . Bronchitis     Patient Active Problem List   Diagnosis Date Noted  . Perianal abscess 03/20/2017    Past Surgical History:  Procedure Laterality Date  . IRRIGATION AND DEBRIDEMENT ABSCESS Right 03/20/2017   Procedure: IRRIGATION AND DEBRIDEMENT ABSCESS , EUA, DRAIN PLACEMENT;  Surgeon: Andria Meuse, MD;  Location: [facility] OR;  Service: General;  Laterality: Right;        Home Medications    Prior to Admission medications   Medication Sig Start Date End Date Taking? Authorizing Provider  acetaminophen (TYLENOL) 500 MG tablet Take 1-2 tablets (500-1,000 mg total) by mouth every 6 (six) hours as needed for mild pain. 03/21/17   Adam Phenix, PA-C  amoxicillin-clavulanate (AUGMENTIN) 875-125 MG tablet Take 1 tablet 2 (two) times daily by mouth. 04/08/17   Loletta Specter, PA-C  cyclobenzaprine (FLEXERIL) 10 MG tablet Take 1 tablet (10 mg total) by mouth at bedtime. 04/16/16   Bethel Born, PA-C  ibuprofen (ADVIL,MOTRIN) 600 MG tablet Take 1 tablet (600 mg total) by mouth every 8 (eight) hours as needed. 03/21/17   Adam Phenix, PA-C  oxyCODONE (OXY IR/ROXICODONE) 5 MG immediate release tablet Take 1 tablet (5 mg total) by mouth every 6 (six) hours as needed for severe pain. Patient not taking: Reported on 04/08/2017 03/21/17   Adam Phenix, PA-C    Family History No family history on file.  Social History Social History   Tobacco Use  . Smoking status: Current Every Day Smoker    Packs/day: 1.00    Types: Cigarettes  . Smokeless tobacco: Never Used  Substance Use Topics  . Alcohol use: Yes    Comment: occasionally  . Drug use: No     Allergies   Banana and Tomato   Review of Systems Review of Systems  Musculoskeletal: Positive for arthralgias. Negative for joint swelling.  Skin: Negative for wound.     Physical Exam Updated Vital Signs BP 110/75   Pulse 81   Temp 98.3 F (36.8 C) (Oral)   Resp 12   SpO2 100%   Physical Exam  Constitutional: He appears well-developed and well-nourished.  HENT:  Head: Normocephalic and atraumatic.  Eyes: Conjunctivae are normal.  Cardiovascular: Intact distal pulses.  Pulmonary/Chest: Effort normal.  Musculoskeletal:  Right wrist and hand without swelling, deformity, color change, or wound.  Medial  aspect of wrist with tenderness.  No anatomical snuffbox tenderness.  No tenderness to palpation of hand bones.  Normal active range of motion in all directions of wrist and hand.  Some pain with active inversion of wrist.  Normal capillary refill.  Normal sensation.  Psychiatric: He has a normal mood and affect. His behavior is normal.  Nursing note and vitals reviewed.    ED Treatments / Results  Labs (all labs ordered are listed, but only abnormal results are displayed) Labs Reviewed - No data to display  EKG None  Radiology No results found.  Procedures Procedures (including  critical care time)  Medications Ordered in ED Medications - No data to display   Initial Impression / Assessment and Plan / ED Course  I have reviewed the triage vital signs and the nursing notes.  Pertinent labs & imaging results that were available during my care of the patient were reviewed by me and considered in my medical decision making (see chart for details).     Patient with right wrist pain, no inciting injury.  Suspect strain due to increased use with heavy lifting.  Joint is not hot, red, or swollen, very low suspicion for septic arthritis.  Normal range of motion.  Neurovascularly intact.  Discussed RICE therapy, NSAIDs, and provide wrist splint for support.  Discussed PCP follow-up if symptoms persist.  Discussed results, findings, treatment and follow up. Patient advised of return precautions. Patient verbalized understanding and agreed with plan.  Final Clinical Impressions(s) / ED Diagnoses   Final diagnoses:  Acute pain of right wrist    ED Discharge Orders    None       Tsion Inghram, [place] N, PA-C 09/28/17 1412    Mancel Bale, MD 09/28/17 2152

## 2018-04-30 ENCOUNTER — Emergency Department (HOSPITAL_COMMUNITY)
Admission: EM | Admit: 2018-04-30 | Discharge: 2018-04-30 | Disposition: A | Discharge status: Home / Self Care | Payer: Self-pay | Attending: Emergency Medicine | Admitting: Emergency Medicine

## 2018-04-30 ENCOUNTER — Other Ambulatory Visit: Payer: Self-pay

## 2018-04-30 ENCOUNTER — Encounter (HOSPITAL_COMMUNITY): Payer: Self-pay | Admitting: *Deleted

## 2018-04-30 DIAGNOSIS — K047 Periapical abscess without sinus: Secondary | ICD-10-CM | POA: Insufficient documentation

## 2018-04-30 DIAGNOSIS — Z79899 Other long term (current) drug therapy: Secondary | ICD-10-CM | POA: Insufficient documentation

## 2018-04-30 DIAGNOSIS — F17210 Nicotine dependence, cigarettes, uncomplicated: Secondary | ICD-10-CM | POA: Insufficient documentation

## 2018-04-30 MED ORDER — BUPIVACAINE-EPINEPHRINE (PF) 0.5% -1:200000 IJ SOLN
1.8000 mL | Freq: Once | INTRAMUSCULAR | Status: AC
  Administered 2018-04-30: 1.8 mL

## 2018-04-30 MED ORDER — LIDOCAINE VISCOUS HCL 2 % MT SOLN: 15.0000 mL | OROMUCOSAL | 0 refills | Status: DC | PRN

## 2018-04-30 MED ORDER — AMOXICILLIN-POT CLAVULANATE 875-125 MG PO TABS
1.0000 | ORAL_TABLET | Freq: Once | ORAL | Status: AC
  Administered 2018-04-30: 1 via ORAL
  Filled 2018-04-30: qty 1

## 2018-04-30 MED ORDER — AMOXICILLIN-POT CLAVULANATE 875-125 MG PO TABS: 1.0000 | ORAL_TABLET | Freq: Two times a day (BID) | ORAL | 0 refills | Status: AC

## 2018-04-30 MED ORDER — CHLORHEXIDINE GLUCONATE 0.12 % MT SOLN: 15.0000 mL | Freq: Two times a day (BID) | OROMUCOSAL | 0 refills | Status: DC

## 2018-04-30 NOTE — Discharge Instructions (Signed)
Please see the information and instructions below regarding your visit.  Your diagnoses today include:  1. Dental infection    You have a dental infection. It is very important that you get evaluated by a dentist as soon as possible. Call tomorrow to schedule an appointment. Ibuprofen as needed for pain. Take your full course of antibiotics. Read the instructions below.  Tests performed today include: See side panel of your discharge paperwork for testing performed today. Vital signs are listed at the bottom of these instructions.   Medications prescribed:    Take any prescribed medications only as prescribed, and any over the counter medications only as directed on the packaging.  1. You are prescribed Augmentin, an antibiotic. Please take all of your antibiotics until finished.   You may develop abdominal discomfort or nausea from the antibiotic. If this occurs, you may take it with food. Some patients also get diarrhea with antibiotics. You may help offset this with probiotics which you can buy or get in yogurt. Do not eat or take the probiotics until 2 hours after your antibiotic. Some women develop vaginal yeast infections after antibiotics. If you develop unusual vaginal discharge after being on this medication, please see your primary care provider.   Some people develop allergies to antibiotics. Symptoms of antibiotic allergy can be mild and include a flat rash and itching. They can also be more serious and include:  ?Hives - Hives are raised, red patches of skin that are usually very itchy.  ?Lip or tongue swelling  ?Trouble swallowing or breathing  ?Blistering of the skin or mouth.  If you have any of these serious symptoms, please seek emergency medical care immediately.  2. You are prescribed chlorhexidine solution swish and spit twice daily. Do not swallow. This is to clear bacteria from your mouth.   3. You are prescribed viscous lidocaine swish and spit every 6 hours as  needed for tooth and gum discomfort. Do not swallow.     Home care instructions:  Please follow any educational materials contained in this packet.   Eat a soft or liquid diet and rinse your mouth out after meals with warm water. You should see a dentist or return here at once if you have increased swelling, increased pain or uncontrolled bleeding from the site of your injury.  Follow-up instructions: It is very important that you see a dentist as soon as possible. There is a list of dentists attached to this packet if you do not have care established with a dentist already. Please give a call to a dentist of your choice tomorrow.  Return instructions:  Please return to the Emergency Department if you experience worsening symptoms.  Please seek care if you note any of the following about your dental pain:  You have increased pain not controlled with medicines.  You have swelling around your tooth, in your face or neck.  You have bleeding which starts, continues, or gets worse.  You have a fever >101 If you are unable to open your mouth Please return if you have any other emergent concerns.  Additional Information:   Your vital signs today were: BP 130/84 (BP Location: Right Arm)    Pulse 80    Temp 98.3 F (36.8 C) (Oral)    Ht 6' (1.829 m)    Wt 59 kg    SpO2 97%    BMI 17.63 kg/m  If your blood pressure (BP) was elevated on multiple readings during this visit above 130  for the top number or above 80 for the bottom number, please have this repeated by your primary care provider within one month. --------------  Thank you for allowing us to participate in your care today.

## 2018-04-30 NOTE — ED Triage Notes (Signed)
PT reports Rt dental pain for 3 days. Pt reports tooth is broken.

## 2018-04-30 NOTE — ED Notes (Signed)
Declined W/C at D/C and was escorted to lobby by RN. 

## 2018-04-30 NOTE — ED Provider Notes (Signed)
[facility]ENCY [facility] Provider Note   CSN: [id number] Arrival date & time: 04/30/18  1600     History   Chief Complaint Chief Complaint  Patient presents with  . Dental Pain    HPI Ronnie Hoover is a 32 y.o. male.  HPI   Ronnie Hoover is a 32 y.o. male with a history of perianal abscess who presents to the Emergency Department complaining of persistent, gradually worsening, right-sided, lower dental pain beginning yesterday. Pt describes their pain as throbbing. Pt has been taking Motrin and Aleve at home with minimal relief of pain. Pain is exacerbated by chewing/palpation, hot/cold. They are not currently followed by dentistry, but did have dental work in 2016 with a similar presentation.  Pt reports he is starting to notice some right facial swelling but denies fever, chills, difficulty breathing, difficulty swallowing.  No history of immune compromising risk factors.  Past Medical History:  Diagnosis Date  . Abscess   . Bronchitis     Patient Active Problem List   Diagnosis Date Noted  . Perianal abscess 03/20/2017    Past Surgical History:  Procedure Laterality Date  . IRRIGATION AND DEBRIDEMENT ABSCESS Right 03/20/2017   Procedure: IRRIGATION AND DEBRIDEMENT ABSCESS , EUA, DRAIN PLACEMENT;  Surgeon: Andria MeuseWhite, Christopher M, MD;  Location: [facility] OR;  Service: General;  Laterality: Right;        Home Medications    Prior to Admission medications   Medication Sig Start Date End Date Taking? Authorizing Provider  acetaminophen (TYLENOL) 500 MG tablet Take 1-2 tablets (500-1,000 mg total) by mouth every 6 (six) hours as needed for mild pain. 03/21/17   Adam PhenixSimaan, Elizabeth S, PA-C  amoxicillin-clavulanate (AUGMENTIN) 875-125 MG tablet Take 1 tablet 2 (two) times daily by mouth. 04/08/17   Loletta SpecterGomez, Roger David, PA-C  cyclobenzaprine (FLEXERIL) 10 MG tablet Take 1 tablet (10 mg total) by mouth at bedtime. 04/16/16   Bethel BornGekas, Kelly Marie, PA-C    ibuprofen (ADVIL,MOTRIN) 600 MG tablet Take 1 tablet (600 mg total) by mouth every 8 (eight) hours as needed. 03/21/17   Adam PhenixSimaan, Elizabeth S, PA-C  oxyCODONE (OXY IR/ROXICODONE) 5 MG immediate release tablet Take 1 tablet (5 mg total) by mouth every 6 (six) hours as needed for severe pain. Patient not taking: Reported on 04/08/2017 03/21/17   Adam PhenixSimaan, Elizabeth S, PA-C    Family History History reviewed. No pertinent family history.  Social History Social History   Tobacco Use  . Smoking status: Current Every Day Smoker    Packs/day: 1.00    Types: Cigarettes  . Smokeless tobacco: Never Used  Substance Use Topics  . Alcohol use: Yes    Comment: occasionally  . Drug use: No     Allergies   Banana and Tomato   Review of Systems Review of Systems  Constitutional: Negative for chills and fever.  HENT: Positive for dental problem. Negative for trouble swallowing and voice change.   Respiratory: Negative for stridor.   Allergic/Immunologic: Negative for immunocompromised state.     Physical Exam Updated Vital Signs BP 130/84 (BP Location: Right Arm)   Pulse 80   Temp 98.3 F (36.8 C) (Oral)   Ht 6' (1.829 m)   Wt 59 kg   SpO2 97%   BMI 17.63 kg/m   Physical Exam  Constitutional: He appears well-developed and well-nourished. No distress.  Sitting comfortably in bed.  HENT:  Head: Normocephalic and atraumatic.  Mouth/Throat:    Dental cavities and poor oral  dentition noted. Pain along tooth as depicted in image. Patient may also have impacted wisdom tooth on same side. Swelling and induration of gum just lateral to affected tooth. No abscess noted. Midline uvula. No trismus. OP clear and moist. No oropharyngeal erythema or edema. Neck supple with no tenderness. No facial edema.  Eyes: Conjunctivae are normal. Right eye exhibits no discharge. Left eye exhibits no discharge.  EOMs normal to gross examination.  Neck: Normal range of motion.  Cardiovascular: Normal  rate and regular rhythm.  Intact, 2+ radial pulse.  Pulmonary/Chest:  Normal respiratory effort. Patient converses comfortably. No audible wheeze or stridor.  Abdominal: He exhibits no distension.  Musculoskeletal: Normal range of motion.  Neurological: He is alert.  Cranial nerves intact to gross observation. Patient moves extremities without difficulty.  Skin: Skin is warm and dry. He is not diaphoretic.  Psychiatric: He has a normal mood and affect. His behavior is normal. Judgment and thought content normal.  Nursing note and vitals reviewed.    ED Treatments / Results  Labs (all labs ordered are listed, but only abnormal results are displayed) Labs Reviewed - No data to display  EKG None  Radiology No results found.  Procedures Dental Block Date/Time: 04/30/2018 5:06 PM Performed by: Elisha Ponder, PA-C Authorized by: Elisha Ponder, PA-C   Consent:    Consent obtained:  Verbal   Consent given by:  Patient   Risks discussed:  Allergic reaction, infection, swelling and unsuccessful block   Alternatives discussed:  No treatment Indications:    Indications: dental pain   Location:    Block type:  Supraperiosteal   Supraperiosteal location:  Lower teeth   Lower teeth location:  31/RL 2nd molar Procedure details (see MAR for exact dosages):    Topical anesthetic:  Benzocaine gel   Syringe type:  Aspirating dental syringe   Needle gauge:  25 G   Anesthetic injected:  Bupivacaine 0.5% WITH epi Post-procedure details:    Outcome:  Anesthesia achieved   Patient tolerance of procedure:  Tolerated well, no immediate complications   (including critical care time)  Medications Ordered in ED Medications  amoxicillin-clavulanate (AUGMENTIN) 875-125 MG per tablet 1 tablet (has no administration in time range)  bupivacaine-epinephrine (MARCAINE W/ EPI) 0.5% -1:200000 injection 1.8 mL (1.8 mLs Infiltration Given 04/30/18 1636)     Initial Impression / Assessment and  Plan / ED Course  I have reviewed the triage vital signs and the nursing notes.  Pertinent labs & imaging results that were available during my care of the patient were reviewed by me and considered in my medical decision making (see chart for details).     Ronnie Hoover is a 32 y.o. male who presents to ED for dental pain. No abscess requiring immediate incision and drainage. Patient is afebrile, non toxic appearing, and swallowing secretions well. Exam not concerning for Ludwig's angina or pharyngeal abscess. Will treat with Augmentin given lateral gum swelling, viscous lidocaine, chlorhexidine. I provided dental resource guide and stressed the importance of dental follow up for ultimate management of dental pain. Patient voices understanding and is agreeable to plan.  Final Clinical Impressions(s) / ED Diagnoses   Final diagnoses:  Dental infection    ED Discharge Orders         Ordered    amoxicillin-clavulanate (AUGMENTIN) 875-125 MG tablet  Every 12 hours     04/30/18 1705    lidocaine (XYLOCAINE) 2 % solution  As needed     04/30/18  1705    chlorhexidine (PERIDEX) 0.12 % solution  2 times daily     04/30/18 1705           Delia Chimes 04/30/18 1707    Raeford Razor, MD 05/01/18 1536

## 2019-08-02 ENCOUNTER — Ambulatory Visit (HOSPITAL_COMMUNITY)
Admission: EM | Admit: 2019-08-02 | Discharge: 2019-08-02 | Disposition: A | Discharge status: Home / Self Care | Payer: Self-pay | Attending: Family Medicine | Admitting: Family Medicine

## 2019-08-02 ENCOUNTER — Ambulatory Visit (INDEPENDENT_AMBULATORY_CARE_PROVIDER_SITE_OTHER): Payer: Self-pay

## 2019-08-02 ENCOUNTER — Other Ambulatory Visit: Payer: Self-pay

## 2019-08-02 ENCOUNTER — Encounter (HOSPITAL_COMMUNITY): Payer: Self-pay | Admitting: Emergency Medicine

## 2019-08-02 DIAGNOSIS — M25522 Pain in left elbow: Secondary | ICD-10-CM

## 2019-08-02 DIAGNOSIS — M7712 Lateral epicondylitis, left elbow: Secondary | ICD-10-CM

## 2019-08-02 MED ORDER — PREDNISONE 10 MG (21) PO TBPK: ORAL_TABLET | ORAL | 0 refills | Status: DC

## 2019-08-02 NOTE — ED Triage Notes (Signed)
Left arm pain from wrist to elbow. Started 2 days ago. Pain is severe  No known injury

## 2019-08-02 NOTE — ED Provider Notes (Addendum)
[facility]    CSN: [id number] Arrival date & time: 08/02/19  1414      History   Chief Complaint Chief Complaint  Patient presents with  . Arm Injury    HPI Ronnie Hoover is a 34 y.o. male.   Patient is a 34 year old male with no significant past medical history.  He presents today with left elbow pain.  This is been present worsening of the past 2 days.  Reporting the pain in the elbow radiates down the forearm and sometimes up into the upper arm.  Pain is pretty severe.  He denies any numbness, tingling or weakness.  He has not taken any medication for symptoms.  No known injuries to the arm.  Does repetitive movements of both arms by doing heavy lifting at work daily. Denies any drug use.   ROS per HPI      Past Medical History:  Diagnosis Date  . Abscess   . Bronchitis     Patient Active Problem List   Diagnosis Date Noted  . Perianal abscess 03/20/2017    Past Surgical History:  Procedure Laterality Date  . IRRIGATION AND DEBRIDEMENT ABSCESS Right 03/20/2017   Procedure: IRRIGATION AND DEBRIDEMENT ABSCESS , EUA, DRAIN PLACEMENT;  Surgeon: Ileana Roup, MD;  Location: [facility];  Service: General;  Laterality: Right;       Home Medications    Prior to Admission medications   Medication Sig Start Date End Date Taking? Authorizing Provider  predniSONE (STERAPRED UNI-PAK 21 TAB) 10 MG (21) TBPK tablet 6 tabs for 1 day, then 5 tabs for 1 das, then 4 tabs for 1 day, then 3 tabs for 1 day, 2 tabs for 1 day, then 1 tab for 1 day 08/02/19   Orvan July, NP    Family History History reviewed. No pertinent family history.  Social History Social History   Tobacco Use  . Smoking status: Current Every Day Smoker    Packs/day: 1.00    Types: Cigarettes  . Smokeless tobacco: Never Used  Substance Use Topics  . Alcohol use: Yes    Comment: occasionally  . Drug use: No     Allergies   Banana and Tomato   Review of Systems Review of  Systems   Physical Exam Triage Vital Signs ED Triage Vitals  Enc Vitals Group     BP 08/02/19 1438 113/75     Pulse Rate 08/02/19 1438 75     Resp 08/02/19 1438 16     Temp 08/02/19 1438 99.1 F (37.3 C)     Temp Source 08/02/19 1438 Oral     SpO2 08/02/19 1438 99 %     Weight --      Height --      Head Circumference --      Peak Flow --      Pain Score 08/02/19 1436 8     Pain Loc --      Pain Edu? --      Excl. in [place]? --    No data found.  Updated Vital Signs BP 113/75   Pulse 75   Temp 99.1 F (37.3 C) (Oral)   Resp 16   SpO2 99%   Visual Acuity Right Eye Distance:   Left Eye Distance:   Bilateral Distance:    Right Eye Near:   Left Eye Near:    Bilateral Near:     Physical Exam Vitals and nursing note reviewed.  Constitutional:  Appearance: Normal appearance.  HENT:     Head: Normocephalic and atraumatic.     Nose: Nose normal.  Eyes:     Conjunctiva/sclera: Conjunctivae normal.  Pulmonary:     Effort: Pulmonary effort is normal.  Musculoskeletal:        General: Tenderness present.     Left elbow: Swelling present. Decreased range of motion. Tenderness present in radial head, medial epicondyle, lateral epicondyle and olecranon process.     Cervical back: Normal range of motion.     Comments: No increased warmth or redness to elbow.  Very limited ROM.   Skin:    General: Skin is warm and dry.  Neurological:     Mental Status: He is alert.  Psychiatric:        Mood and Affect: Mood normal.      UC Treatments / Results  Labs (all labs ordered are listed, but only abnormal results are displayed) Labs Reviewed - No data to display  EKG   Radiology DG Elbow Complete Left  Result Date: 08/02/2019 CLINICAL DATA:  Acute left elbow pain without known injury. EXAM: LEFT ELBOW - COMPLETE 3+ VIEW COMPARISON:  None. FINDINGS: There is no evidence of fracture, dislocation, or joint effusion. There is no evidence of arthropathy or other focal  bone abnormality. Soft tissues are unremarkable. IMPRESSION: Negative. Electronically Signed   By: Lupita Raider M.D.   On: 08/02/2019 15:15    Procedures Procedures (including critical care time)  Medications Ordered in UC Medications - No data to display  Initial Impression / Assessment and Plan / UC Course  I have reviewed the triage vital signs and the nursing notes.  Pertinent labs & imaging results that were available during my care of the patient were reviewed by me and considered in my medical decision making (see chart for details).     Epicondylitis-x-ray negative for any acute abnormalities. Treatment prednisone taper over the next 6 days. Recommended purchase a elbow sleeve over-the-counter for support Work note given for a few days off to rest Also recommended ice Follow up as needed for continued or worsening symptoms  Final Clinical Impressions(s) / UC Diagnoses   Final diagnoses:  Lateral epicondylitis, left elbow     Discharge Instructions     Take the prednisone taper as prescribed. Recommend purchase an elbow sleeve preferably with copper over-the-counter.  You can get these at any local sports store, [employer] or [employer]. Do ice to the elbow 3-4 times a day for 10 to 15 minutes at a time. Follow up as needed for continued or worsening symptoms     ED Prescriptions    Medication Sig Dispense Auth. Provider   predniSONE (STERAPRED UNI-PAK 21 TAB) 10 MG (21) TBPK tablet 6 tabs for 1 day, then 5 tabs for 1 das, then 4 tabs for 1 day, then 3 tabs for 1 day, 2 tabs for 1 day, then 1 tab for 1 day 21 tablet Gerold Sar A, NP     PDMP not reviewed this encounter.   Janace Aris, NP 08/02/19 1531    Janace Aris, NP 08/02/19 1534

## 2019-08-02 NOTE — Discharge Instructions (Addendum)
Take the prednisone taper as prescribed. Recommend purchase an elbow sleeve preferably with copper over-the-counter.  You can get these at any local sports store, [employer] or [employer]. Do ice to the elbow 3-4 times a day for 10 to 15 minutes at a time. Follow up as needed for continued or worsening symptoms

## 2020-01-05 ENCOUNTER — Ambulatory Visit (HOSPITAL_COMMUNITY)
Admission: EM | Admit: 2020-01-05 | Discharge: 2020-01-05 | Disposition: A | Discharge status: Home / Self Care | Payer: Self-pay | Attending: Emergency Medicine | Admitting: Emergency Medicine

## 2020-01-05 ENCOUNTER — Encounter (HOSPITAL_COMMUNITY): Payer: Self-pay

## 2020-01-05 ENCOUNTER — Other Ambulatory Visit: Payer: Self-pay

## 2020-01-05 DIAGNOSIS — M5442 Lumbago with sciatica, left side: Secondary | ICD-10-CM

## 2020-01-05 MED ORDER — PREDNISONE 10 MG PO TABS: ORAL_TABLET | ORAL | 0 refills | Status: DC

## 2020-01-05 MED ORDER — CYCLOBENZAPRINE HCL 5 MG PO TABS: 5.0000 mg | ORAL_TABLET | Freq: Two times a day (BID) | ORAL | 0 refills | Status: DC | PRN

## 2020-01-05 NOTE — ED Triage Notes (Signed)
Pt presents with left side body pain that starts left mid thigh and shoots up to left shoulder X 2 days.

## 2020-01-05 NOTE — Discharge Instructions (Signed)
Prednisone taper x6 days-begin with 6 tabs on day 1, decrease by 1 tablet each day until complete You may use flexeril as needed to help with pain. This is a muscle relaxer and causes sedation- please use only at bedtime or when you will be home and not have to drive/work Alternate ice and heat Gentle stretching avoid bedrest  Follow-up if not improving or worsening

## 2020-01-05 NOTE — ED Provider Notes (Signed)
[facility]    CSN: [id number] Arrival date & time: 01/05/20  2703      History   Chief Complaint Chief Complaint  Patient presents with  . Left Side Body Pain    HPI Ronnie Hoover is a 34 y.o. male presenting today for evaluation of left-sided back pain.  Patient reports that over the past 2 days he has had pain in his left lower back that radiates into his left leg.  He denies specific injury trauma or fall.  Denies heavy lifting or physical labor at his job.  Has had worsening pain with certain movements.  Does describe a pins and needle sensation at times the pain has been constant.  Denies any urinary symptoms of dysuria, decreased frequency, hesitancy or hematuria.  Denies history of similar.  Used ibuprofen 600 mg without relief.  HPI  Past Medical History:  Diagnosis Date  . Abscess   . Bronchitis     Patient Active Problem List   Diagnosis Date Noted  . Perianal abscess 03/20/2017    Past Surgical History:  Procedure Laterality Date  . IRRIGATION AND DEBRIDEMENT ABSCESS Right 03/20/2017   Procedure: IRRIGATION AND DEBRIDEMENT ABSCESS , EUA, DRAIN PLACEMENT;  Surgeon: Andria Meuse, MD;  Location: [facility];  Service: General;  Laterality: Right;       Home Medications    Prior to Admission medications   Medication Sig Start Date End Date Taking? Authorizing Provider  cyclobenzaprine (FLEXERIL) 5 MG tablet Take 1-2 tablets (5-10 mg total) by mouth 2 (two) times daily as needed for muscle spasms. 01/05/20   Creedence Heiss C, PA-C  predniSONE (DELTASONE) 10 MG tablet Begin with 6 tabs on day 1, 5 tab on day 2, 4 tab on day 3, 3 tab on day 4, 2 tab on day 5, 1 tab on day 6-take with food 01/05/20   Ardon Franklin, Junius Creamer, PA-C    Family History Family History  Family history unknown: Yes    Social History Social History   Tobacco Use  . Smoking status: Current Every Day Smoker    Packs/day: 1.00    Types: Cigarettes  . Smokeless tobacco:  Never Used  Substance Use Topics  . Alcohol use: Yes    Comment: occasionally  . Drug use: No     Allergies   Banana and Tomato   Review of Systems Review of Systems  Constitutional: Negative for fatigue and fever.  Eyes: Negative for redness, itching and visual disturbance.  Respiratory: Negative for shortness of breath.   Cardiovascular: Negative for chest pain and leg swelling.  Gastrointestinal: Negative for nausea and vomiting.  Genitourinary: Negative for decreased urine volume, difficulty urinating and hematuria.  Musculoskeletal: Positive for back pain and myalgias. Negative for arthralgias.  Skin: Negative for color change, rash and wound.  Neurological: Negative for dizziness, syncope, weakness, light-headedness and headaches.     Physical Exam Triage Vital Signs ED Triage Vitals  Enc Vitals Group     BP 01/05/20 1003 110/72     Pulse Rate 01/05/20 1003 60     Resp 01/05/20 1003 18     Temp 01/05/20 1003 98.7 F (37.1 C)     Temp Source 01/05/20 1003 Oral     SpO2 01/05/20 1003 97 %     Weight --      Height --      Head Circumference --      Peak Flow --      Pain Score  01/05/20 1002 8     Pain Loc --      Pain Edu? --      Excl. in [place]? --    No data found.  Updated Vital Signs BP 110/72 (BP Location: Right Arm)   Pulse 60   Temp 98.7 F (37.1 C) (Oral)   Resp 18   SpO2 97%   Visual Acuity Right Eye Distance:   Left Eye Distance:   Bilateral Distance:    Right Eye Near:   Left Eye Near:    Bilateral Near:     Physical Exam Vitals and nursing note reviewed.  Constitutional:      Appearance: He is well-developed.     Comments: No acute distress  HENT:     Head: Normocephalic and atraumatic.     Nose: Nose normal.  Eyes:     Conjunctiva/sclera: Conjunctivae normal.  Cardiovascular:     Rate and Rhythm: Normal rate.  Pulmonary:     Effort: Pulmonary effort is normal. No respiratory distress.  Abdominal:     General: There is no  distension.  Musculoskeletal:        General: Normal range of motion.     Cervical back: Neck supple.     Comments: Ambulating with antalgia Data tender to palpation diffusely throughout lumbar spine midline, increased tenderness throughout left lumbar area  Strength at hips and knees 5/5 and equal bilaterally-pain elicited with resisted hip flexion Patellar reflex 2+ bilaterally  Skin:    General: Skin is warm and dry.  Neurological:     Mental Status: He is alert and oriented to person, place, and time.      UC Treatments / Results  Labs (all labs ordered are listed, but only abnormal results are displayed) Labs Reviewed - No data to display  EKG   Radiology No results found.  Procedures Procedures (including critical care time)  Medications Ordered in UC Medications - No data to display  Initial Impression / Assessment and Plan / UC Course  I have reviewed the triage vital signs and the nursing notes.  Pertinent labs & imaging results that were available during my care of the patient were reviewed by me and considered in my medical decision making (see chart for details).     Low back pain with left-sided radicular distribution-treating with prednisone taper, muscle relaxers, activity modification and gentle stretching.  Discussed strict return precautions. Patient verbalized understanding and is agreeable with plan.  Final Clinical Impressions(s) / UC Diagnoses   Final diagnoses:  Acute left-sided low back pain with left-sided sciatica     Discharge Instructions     Prednisone taper x6 days-begin with 6 tabs on day 1, decrease by 1 tablet each day until complete You may use flexeril as needed to help with pain. This is a muscle relaxer and causes sedation- please use only at bedtime or when you will be home and not have to drive/work Alternate ice and heat Gentle stretching avoid bedrest  Follow-up if not improving or worsening    ED Prescriptions     Medication Sig Dispense Auth. Provider   predniSONE (DELTASONE) 10 MG tablet Begin with 6 tabs on day 1, 5 tab on day 2, 4 tab on day 3, 3 tab on day 4, 2 tab on day 5, 1 tab on day 6-take with food 21 tablet Lucah Petta C, PA-C   cyclobenzaprine (FLEXERIL) 5 MG tablet Take 1-2 tablets (5-10 mg total) by mouth 2 (two) times daily as  needed for muscle spasms. 24 tablet Abcde Oneil, Earl[place] C, PA-C     PDMP not reviewed this encounter.   Lew Dawes, PA-C 01/05/20 1029

## 2020-02-07 ENCOUNTER — Other Ambulatory Visit: Payer: Self-pay

## 2020-02-07 ENCOUNTER — Encounter (HOSPITAL_COMMUNITY): Payer: Self-pay

## 2020-02-07 ENCOUNTER — Encounter (HOSPITAL_COMMUNITY): Payer: Self-pay | Admitting: Emergency Medicine

## 2020-02-07 ENCOUNTER — Ambulatory Visit (INDEPENDENT_AMBULATORY_CARE_PROVIDER_SITE_OTHER): Payer: Medicaid Other

## 2020-02-07 ENCOUNTER — Emergency Department (HOSPITAL_COMMUNITY)
Admission: EM | Admit: 2020-02-07 | Discharge: 2020-02-07 | Disposition: A | Discharge status: Home / Self Care | Payer: Medicaid Other | Attending: Emergency Medicine | Admitting: Emergency Medicine

## 2020-02-07 ENCOUNTER — Ambulatory Visit (HOSPITAL_COMMUNITY)
Admission: EM | Admit: 2020-02-07 | Discharge: 2020-02-07 | Disposition: A | Discharge status: Home / Self Care | Payer: Medicaid Other | Attending: Family Medicine | Admitting: Family Medicine

## 2020-02-07 DIAGNOSIS — M5441 Lumbago with sciatica, right side: Secondary | ICD-10-CM

## 2020-02-07 DIAGNOSIS — M5432 Sciatica, left side: Secondary | ICD-10-CM | POA: Insufficient documentation

## 2020-02-07 DIAGNOSIS — M545 Low back pain: Secondary | ICD-10-CM

## 2020-02-07 DIAGNOSIS — Z5321 Procedure and treatment not carried out due to patient leaving prior to being seen by health care provider: Secondary | ICD-10-CM | POA: Insufficient documentation

## 2020-02-07 DIAGNOSIS — K0889 Other specified disorders of teeth and supporting structures: Secondary | ICD-10-CM

## 2020-02-07 DIAGNOSIS — M5442 Lumbago with sciatica, left side: Secondary | ICD-10-CM

## 2020-02-07 MED ORDER — TRAMADOL HCL 50 MG PO TABS: 50.0000 mg | ORAL_TABLET | Freq: Two times a day (BID) | ORAL | 0 refills | Status: DC | PRN

## 2020-02-07 MED ORDER — CYCLOBENZAPRINE HCL 5 MG PO TABS: 5.0000 mg | ORAL_TABLET | Freq: Two times a day (BID) | ORAL | 0 refills | Status: DC | PRN

## 2020-02-07 MED ORDER — PREDNISONE 10 MG (21) PO TBPK: ORAL_TABLET | ORAL | 0 refills | Status: DC

## 2020-02-07 NOTE — ED Notes (Signed)
Pt called for vital check no response  

## 2020-02-07 NOTE — ED Notes (Signed)
Pt not responding to vitals recheck  

## 2020-02-07 NOTE — ED Triage Notes (Signed)
Pt presents with ongoing sciatic pain for over a week radiating from lower back down left side.  Pt also presents with right side dental pain X 2 days.

## 2020-02-07 NOTE — ED Notes (Signed)
Pt called for vital check 3x no response moving off the floor.

## 2020-02-07 NOTE — Discharge Instructions (Signed)
Refilled the muscle relaxants and will try another round of prednisone.  Tramadol for severe pain as needed. Be aware of drowsiness of these medicines.  Rest, heat to the back.  Stretches.  Work note given.

## 2020-02-07 NOTE — ED Triage Notes (Signed)
Pt reports he was diagnosed w/ sciatica on the left side, continues to cause him pain. Pt also reports dental pain on the right lower side of his mouth.

## 2020-02-08 ENCOUNTER — Telehealth (HOSPITAL_COMMUNITY): Payer: Self-pay | Admitting: Family Medicine

## 2020-02-08 MED ORDER — AMOXICILLIN 500 MG PO CAPS: 1000.0000 mg | ORAL_CAPSULE | Freq: Two times a day (BID) | ORAL | 0 refills | Status: DC

## 2020-02-08 NOTE — ED Provider Notes (Signed)
[facility]    CSN: [id number] Arrival date & time: 02/07/20  0488      History   Chief Complaint Chief Complaint  Patient presents with   Back Pain   Dental Pain    HPI Ronnie Hoover is a 34 y.o. male.   Patient is a 34 year old male who presents today with chronic back pain and pain radiating down the left side.  This is been ongoing issue for over a week.  This problem comes and goes.  Was last seen for this issue on 01/05/2020.  Was treated with prednisone taper and muscle relaxants.  Pain somewhat relieved but has returned.  Denies any specific injuries to the back.  He is also has right-sided dental pain x2 days.  Gingival swelling.  No fever or trismus.     Past Medical History:  Diagnosis Date   Abscess    Bronchitis     Patient Active Problem List   Diagnosis Date Noted   Perianal abscess 03/20/2017    Past Surgical History:  Procedure Laterality Date   IRRIGATION AND DEBRIDEMENT ABSCESS Right 03/20/2017   Procedure: IRRIGATION AND DEBRIDEMENT ABSCESS , EUA, DRAIN PLACEMENT;  Surgeon: Andria Meuse, MD;  Location: [facility];  Service: General;  Laterality: Right;       Home Medications    Prior to Admission medications   Medication Sig Start Date End Date Taking? Authorizing Provider  cyclobenzaprine (FLEXERIL) 5 MG tablet Take 1-2 tablets (5-10 mg total) by mouth 2 (two) times daily as needed for muscle spasms. 02/07/20   Dahlia Byes A, NP  predniSONE (STERAPRED UNI-PAK 21 TAB) 10 MG (21) TBPK tablet 6 tabs for 1 day, then 5 tabs for 1 das, then 4 tabs for 1 day, then 3 tabs for 1 day, 2 tabs for 1 day, then 1 tab for 1 day 02/07/20   Dahlia Byes A, NP  traMADol (ULTRAM) 50 MG tablet Take 1 tablet (50 mg total) by mouth every 12 (twelve) hours as needed. 02/07/20   Janace Aris, NP    Family History Family History  Family history unknown: Yes    Social History Social History   Tobacco Use   Smoking status: Current Every Day  Smoker    Packs/day: 1.00    Types: Cigarettes   Smokeless tobacco: Never Used  Substance Use Topics   Alcohol use: Yes    Comment: occasionally   Drug use: No     Allergies   Banana and Tomato   Review of Systems Review of Systems   Physical Exam Triage Vital Signs ED Triage Vitals  Enc Vitals Group     BP 02/07/20 0939 122/80     Pulse Rate 02/07/20 0939 60     Resp 02/07/20 0939 18     Temp 02/07/20 0939 98.3 F (36.8 C)     Temp Source 02/07/20 0939 Oral     SpO2 02/07/20 0939 98 %     Weight --      Height --      Head Circumference --      Peak Flow --      Pain Score 02/07/20 0937 8     Pain Loc --      Pain Edu? --      Excl. in [place]? --    No data found.  Updated Vital Signs BP 122/80 (BP Location: Right Arm)    Pulse 60    Temp 98.3 F (36.8 C) (Oral)  Resp 18    SpO2 98%   Visual Acuity Right Eye Distance:   Left Eye Distance:   Bilateral Distance:    Right Eye Near:   Left Eye Near:    Bilateral Near:     Physical Exam Vitals and nursing note reviewed.  Constitutional:      Appearance: Normal appearance.  HENT:     Head: Normocephalic and atraumatic.     Nose: Nose normal.     Mouth/Throat:     Dentition: Dental tenderness, gingival swelling and dental caries present.   Eyes:     Conjunctiva/sclera: Conjunctivae normal.  Pulmonary:     Effort: Pulmonary effort is normal.  Musculoskeletal:        General: Normal range of motion.     Cervical back: Normal range of motion.       Back:     Comments: TTP at midline   Skin:    General: Skin is warm and dry.  Neurological:     Mental Status: He is alert.  Psychiatric:        Mood and Affect: Mood normal.      UC Treatments / Results  Labs (all labs ordered are listed, but only abnormal results are displayed) Labs Reviewed - No data to display  EKG   Radiology DG Lumbar Spine Complete  Result Date: 02/07/2020 CLINICAL DATA:  Low back pain with left-sided radicular  symptoms EXAM: LUMBAR SPINE - COMPLETE 4+ VIEW COMPARISON:  April 16, 2016 FINDINGS: Frontal, lateral, spot lumbosacral lateral, and bilateral oblique views were obtained. There are 5 non-rib-bearing lumbar type vertebral bodies. There is minimal lower lumbar levoscoliosis. There is no fracture or spondylolisthesis. Disc spaces appear normal. There is no appreciable facet arthropathy. IMPRESSION: Minimal scoliosis. No fracture or spondylolisthesis. No appreciable arthropathy. Electronically Signed   By: Bretta Bang III M.D.   On: 02/07/2020 10:45    Procedures Procedures (including critical care time)  Medications Ordered in UC Medications - No data to display  Initial Impression / Assessment and Plan / UC Course  I have reviewed the triage vital signs and the nursing notes.  Pertinent labs & imaging results that were available during my care of the patient were reviewed by me and considered in my medical decision making (see chart for details).     Dental pain Treating for possible underlying infection with amoxicillin Recommend follow-up with dentist.  Low back pain with sciatica Treating with prednisone taper, Flexeril for muscle relaxant and tramadol for severe pain as needed. Instructions given, work note given Final Clinical Impressions(s) / UC Diagnoses   Final diagnoses:  Pain, dental  Acute midline low back pain with bilateral sciatica     Discharge Instructions     Refilled the muscle relaxants and will try another round of prednisone.  Tramadol for severe pain as needed. Be aware of drowsiness of these medicines.  Rest, heat to the back.  Stretches.  Work note given.     ED Prescriptions    Medication Sig Dispense Auth. Provider   cyclobenzaprine (FLEXERIL) 5 MG tablet Take 1-2 tablets (5-10 mg total) by mouth 2 (two) times daily as needed for muscle spasms. 24 tablet Teagan Heidrick A, NP   predniSONE (STERAPRED UNI-PAK 21 TAB) 10 MG (21) TBPK tablet 6  tabs for 1 day, then 5 tabs for 1 das, then 4 tabs for 1 day, then 3 tabs for 1 day, 2 tabs for 1 day, then 1 tab for 1 day 21  tablet Rylann Munford A, NP   traMADol (ULTRAM) 50 MG tablet Take 1 tablet (50 mg total) by mouth every 12 (twelve) hours as needed. 12 tablet Delicia Berens A, NP     I have reviewed the PDMP during this encounter.   Janace Aris, NP 02/08/20 [phone]

## 2020-02-08 NOTE — Telephone Encounter (Signed)
Sending abx to the pharmacy for dental infection

## 2020-05-23 ENCOUNTER — Other Ambulatory Visit: Payer: Self-pay

## 2020-05-23 ENCOUNTER — Emergency Department (HOSPITAL_COMMUNITY)
Admission: EM | Admit: 2020-05-23 | Discharge: 2020-05-24 | Disposition: A | Discharge status: Home / Self Care | Payer: Medicaid Other | Attending: Emergency Medicine | Admitting: Emergency Medicine

## 2020-05-23 ENCOUNTER — Encounter (HOSPITAL_COMMUNITY): Payer: Self-pay

## 2020-05-23 DIAGNOSIS — F17210 Nicotine dependence, cigarettes, uncomplicated: Secondary | ICD-10-CM | POA: Insufficient documentation

## 2020-05-23 DIAGNOSIS — Z20822 Contact with and (suspected) exposure to covid-19: Secondary | ICD-10-CM | POA: Insufficient documentation

## 2020-05-23 DIAGNOSIS — F333 Major depressive disorder, recurrent, severe with psychotic symptoms: Secondary | ICD-10-CM | POA: Insufficient documentation

## 2020-05-23 DIAGNOSIS — F332 Major depressive disorder, recurrent severe without psychotic features: Secondary | ICD-10-CM | POA: Diagnosis present

## 2020-05-23 DIAGNOSIS — R45851 Suicidal ideations: Secondary | ICD-10-CM | POA: Insufficient documentation

## 2020-05-23 LAB — ETHANOL: Alcohol, Ethyl (B): <10 mg/dL (ref <10)

## 2020-05-23 LAB — CBC
HCT: 49 % (ref 39.0–52.0)
Hemoglobin: 16.4 g/dL (ref 13.0–17.0)
MCH: 33.7 pg (ref 26.0–34.0)
MCHC: 33.5 g/dL (ref 30.0–36.0)
MCV: 100.8 fL — ABNORMAL HIGH (ref 80.0–100.0)
Platelets: 271 10*3/uL (ref 150–400)
RBC: 4.86 MIL/uL (ref 4.22–5.81)
RDW: 12.7 % (ref 11.5–15.5)
WBC: 7.2 10*3/uL (ref 4.0–10.5)
nRBC: 0 % (ref 0.0–0.2)

## 2020-05-23 LAB — COMPREHENSIVE METABOLIC PANEL
ALT: 18 U/L (ref 0–44)
AST: 22 U/L (ref 15–41)
Albumin: 4.3 g/dL (ref 3.5–5.0)
Alkaline Phosphatase: 57 U/L (ref 38–126)
Anion gap: 8 (ref 5–15)
BUN: 7 mg/dL (ref 6–20)
CO2: 24 mmol/L (ref 22–32)
Calcium: 9.5 mg/dL (ref 8.9–10.3)
Chloride: 106 mmol/L (ref 98–111)
Creatinine, Ser: 0.88 mg/dL (ref 0.61–1.24)
GFR, Estimated: >60 mL/min (ref >60)
Glucose, Bld: 103 mg/dL — ABNORMAL HIGH (ref 70–99)
Potassium: 4.2 mmol/L (ref 3.5–5.1)
Sodium: 138 mmol/L (ref 135–145)
Total Bilirubin: 0.6 mg/dL (ref 0.3–1.2)
Total Protein: 8.1 g/dL (ref 6.5–8.1)

## 2020-05-23 LAB — ACETAMINOPHEN LEVEL: Acetaminophen (Tylenol), Serum: <10 ug/mL — ABNORMAL LOW (ref 10–30)

## 2020-05-23 LAB — SALICYLATE LEVEL: Salicylate Lvl: <7 mg/dL — ABNORMAL LOW (ref 7.0–30.0)

## 2020-05-23 MED ORDER — ACETAMINOPHEN 325 MG PO TABS: 650.0000 mg | ORAL_TABLET | Freq: Once | ORAL | Status: DC

## 2020-05-23 NOTE — ED Triage Notes (Signed)
Patient states he is suicidal and attempted to take Xanax, but friend stopped him. Patient also reports that he has cut his wrist approx 3 months ago.  Patient reports that he is hearing voices that tell him that he does not belong here.  Patient denies HI, visual hallucinations.  Patient states he had 2 beers today and marijuana 3 days ago.

## 2020-05-23 NOTE — ED Provider Notes (Signed)
[facility]-EMERGENCY DEPT Provider Note   CSN: [id number] Arrival date & time: 05/23/20  1636     History Chief Complaint  Patient presents with  . Suicidal    Ronnie Hoover is a 34 y.o. male with no significant past medical history who presents to the ED voluntarily due to suicidal ideations. Patient states he attempted to take a handful of Xanax to kill himself prior to arrival; however, his friend stopped him. Denies any ingestion. He admits to cutting his left forearm 3 months ago. He admits to hearing voices saying he's not loved. Denies any mental health illness. He states he believes he has been battling depression for a while now. No currently daily medication. Admits to smoking 2-3 beers a day. Smokes 1-2 packs of cigarettes a day. Smokes marijuana, but denies any other drug use. Denies HI and visual hallucinations. Admits to frequent headaches because he "hasn't been eating recently". Denies associated visual changes, speech changes, dizziness, and unilateral weakness.  History obtained from patient and past medical records. No interpreter used during encounter.      Past Medical History:  Diagnosis Date  . Abscess   . Bronchitis     Patient Active Problem List   Diagnosis Date Noted  . Perianal abscess 03/20/2017    Past Surgical History:  Procedure Laterality Date  . IRRIGATION AND DEBRIDEMENT ABSCESS Right 03/20/2017   Procedure: IRRIGATION AND DEBRIDEMENT ABSCESS , EUA, DRAIN PLACEMENT;  Surgeon: Andria Meuse, MD;  Location: [facility] OR;  Service: General;  Laterality: Right;       Family History  Family history unknown: Yes    Social History   Tobacco Use  . Smoking status: Current Every Day Smoker    Packs/day: 1.00    Types: Cigarettes  . Smokeless tobacco: Never Used  Vaping Use  . Vaping Use: Never used  Substance Use Topics  . Alcohol use: Yes    Comment: occasionally  . Drug use: Yes    Types: Marijuana    Home  Medications Prior to Admission medications   Medication Sig Start Date End Date Taking? Authorizing Provider  amoxicillin (AMOXIL) 500 MG capsule Take 2 capsules (1,000 mg total) by mouth 2 (two) times daily. 02/08/20   Dahlia Byes A, NP  cyclobenzaprine (FLEXERIL) 5 MG tablet Take 1-2 tablets (5-10 mg total) by mouth 2 (two) times daily as needed for muscle spasms. 02/07/20   Dahlia Byes A, NP  predniSONE (STERAPRED UNI-PAK 21 TAB) 10 MG (21) TBPK tablet 6 tabs for 1 day, then 5 tabs for 1 das, then 4 tabs for 1 day, then 3 tabs for 1 day, 2 tabs for 1 day, then 1 tab for 1 day 02/07/20   Dahlia Byes A, NP  traMADol (ULTRAM) 50 MG tablet Take 1 tablet (50 mg total) by mouth every 12 (twelve) hours as needed. 02/07/20   Janace Aris, NP    Allergies    Banana and Tomato  Review of Systems   Review of Systems  Neurological: Positive for headaches. Negative for dizziness, facial asymmetry, speech difficulty and weakness.  Psychiatric/Behavioral: Positive for hallucinations, self-injury and suicidal ideas.  All other systems reviewed and are negative.   Physical Exam Updated Vital Signs BP (!) 151/91 (BP Location: Right Arm)   Pulse 63   Temp 98.1 F (36.7 C) (Oral)   Resp 17   Ht 6\' 1"  (1.854 m)   Wt 56.7 kg   SpO2 97%   BMI 16.49  kg/m   Physical Exam Vitals and nursing note reviewed.  Constitutional:      General: He is not in acute distress.    Appearance: He is not ill-appearing.  HENT:     Head: Normocephalic.  Eyes:     Pupils: Pupils are equal, round, and reactive to light.  Cardiovascular:     Rate and Rhythm: Normal rate and regular rhythm.     Pulses: Normal pulses.     Heart sounds: Normal heart sounds. No murmur heard. No friction rub. No gallop.   Pulmonary:     Effort: Pulmonary effort is normal.     Breath sounds: Normal breath sounds.  Abdominal:     General: Abdomen is flat. There is no distension.     Palpations: Abdomen is soft.     Tenderness: There  is no abdominal tenderness. There is no guarding or rebound.  Musculoskeletal:        General: Normal range of motion.     Cervical back: Neck supple.  Skin:    General: Skin is warm and dry.  Neurological:     General: No focal deficit present.     Mental Status: He is alert.     Comments: Cranial nerves grossly intact. Speech. No facial droop. Patient able to move all 4 extremities without difficulty.  Psychiatric:        Attention and Perception: He perceives auditory hallucinations.        Mood and Affect: Mood is depressed. Affect is flat.        Behavior: Behavior normal.        Thought Content: Thought content includes suicidal ideation. Thought content does not include homicidal ideation. Thought content includes suicidal plan.     ED Results / Procedures / Treatments   Labs (all labs ordered are listed, but only abnormal results are displayed) Labs Reviewed  COMPREHENSIVE METABOLIC PANEL - Abnormal; Notable for the following components:      Result Value   Glucose, Bld 103 (*)    All other components within normal limits  SALICYLATE LEVEL - Abnormal; Notable for the following components:   Salicylate Lvl <7.0 (*)    All other components within normal limits  ACETAMINOPHEN LEVEL - Abnormal; Notable for the following components:   Acetaminophen (Tylenol), Serum <10 (*)    All other components within normal limits  CBC - Abnormal; Notable for the following components:   MCV 100.8 (*)    All other components within normal limits  RESP PANEL BY RT-PCR (FLU A&B, COVID) ARPGX2  ETHANOL  RAPID URINE DRUG SCREEN, HOSP PERFORMED    EKG None  Radiology No results found.  Procedures Procedures (including critical care time)  Medications Ordered in ED Medications  acetaminophen (TYLENOL) tablet 650 mg (has no administration in time range)    ED Course  I have reviewed the triage vital signs and the nursing notes.  Pertinent labs & imaging results that were  available during my care of the patient were reviewed by me and considered in my medical decision making (see chart for details).    MDM Rules/Calculators/A&P                         34 year old male presents the ED voluntarily due to suicidal ideations and auditory hallucinations. Admits to cutting his left forearm 3 months ago. Denies any ingestion earlier today. Upon arrival, stable vitals. BP elevated 131/91. Low suspicion for hypertensive urgency/emergency. Physical exam  reassuring. Normal neurological exam. Abdomen soft, nondistended, nontender. medical clearance labs ordered. Tylenol given for headache. Normal neurological exam. Low suspicion for [facility] or other emergent intracranial abnormalities.   CBC reassuring with no leukocytosis and normal hemoglobin.  CMP significant for mild hyperglycemia at 103 with no anion gap.  Normal renal function no major electrolyte derangements.  Ethanol, acetaminophen, and salicylate level within normal limits.  Patient has been medically cleared for TTS evaluation.  The patient has been placed in psychiatric observation due to the need to provide a safe environment for the patient while obtaining psychiatric consultation and evaluation, as well as ongoing medical and medication management to treat the patient's condition.  The patient has not been placed under full IVC at this time. Final Clinical Impression(s) / ED Diagnoses Final diagnoses:  Suicidal ideation    Rx / DC Orders ED Discharge Orders    None       Jesusita Oka 05/23/20 2041    Terald Sleeper, MD 05/23/20 2250

## 2020-05-23 NOTE — BH Assessment (Signed)
Disposition: Per Nira Conn, NP pt is recommended for inpatient psychiatric treatment   Weyman Pedro, MSW, LCSW Outpatient Therapist/Triage Specialist

## 2020-05-23 NOTE — BH Assessment (Signed)
Comprehensive Clinical Assessment (CCA) Note  05/23/2020 Ronnie Hoover [id number]  Chief Complaint:  Chief Complaint  Patient presents with   Suicidal   Visit Diagnosis:   Major depressive disorder, recurrent, with psychotic features  Suicidal ideation  Ronnie Hoover is a 34 yo male currently in [place] after recent suicide attempt. Pt attempted to take pills and was stopped by a friend and brought to the hospital.  Pt reports that he has been feeling very sad and down recently--thinks about his kids and not being able to spend time with them. Pt reports that he has had suicidal ideation in the past, and has had previous attempts: cutting wrist with knife, attempting to strangle self. Pt reports that a recent break up was the trigger for this last suicidal attempt. Pt denies any HI and states that he often hears voices telling him to hurt himself. Pt is supported by his sister "she won't let me out of her sight", and this is a comfort to him. Pt reports that he has been feeling very down, depressed, and hopeless for the past few months. Pt also reports experiencing some anxiety symptoms. Pt reports that he is willing to do what he needs to do to get the help he needs. Pt very emotional and tearful throughout assessment. Pt feels that he is a danger to himself at time of assessment.  Weyman Pedro, MSW, LCSW Outpatient Therapist/Triage Specialist  Disposition: Per Nira Conn, NP pt is recommended for inpatient psychiatric treatment   CCA Screening, Triage and Referral (STR)  Patient Reported Information How did you hear about [place]? Family/Friend  Referral name: Friend brought to hospital  Referral phone number: No data recorded  Whom do you see for routine medical problems? No data recorded Practice/Facility Name: No data recorded Practice/Facility Phone Number: No data recorded Name of Contact: No data recorded Contact Number: No data recorded Contact Fax Number: No data  recorded Prescriber Name: No data recorded Prescriber Address (if known): No data recorded  What Is the Reason for Your Visit/Call Today? No data recorded How Long Has This Been Causing You Problems? > than 6 months  What Do You Feel Would Help You the Most Today? Assessment Only; Therapy; Medication   Have You Recently Been in Any Inpatient Treatment (Hospital/Detox/Crisis Center/28-Day Program)? No  Name/Location of Program/Hospital:No data recorded How Long Were You There? No data recorded When Were You Discharged? No data recorded  Have You Ever Received Services From [facility] Before? Yes  Who Do You See at [facility]? No data recorded  Have You Recently Had Any Thoughts About Hurting Yourself? Yes (suicide attempt--tried to take pills)  Are You Planning to Commit Suicide/Harm Yourself At This time? Yes   Have you Recently Had Thoughts About Hurting Someone Karolee Ohs? No  Explanation: No data recorded  Have You Used Any Alcohol or Drugs in the Past 24 Hours? Yes  How Long Ago Did You Use Drugs or Alcohol? No data recorded What Did You Use and How Much? drank etoh and smoked marijuana   Do You Currently Have a Therapist/Psychiatrist? No  Name of Therapist/Psychiatrist: No data recorded  Have You Been Recently Discharged From Any Office Practice or Programs? No  Explanation of Discharge From Practice/Program: No data recorded    CCA Screening Triage Referral Assessment Type of Contact: Tele-Assessment  Is this Initial or Reassessment? Initial Assessment  Date Telepsych consult ordered in [facility]:  05/23/2020  Time Telepsych consult ordered in [facility]:  1957   Patient  Reported Information Reviewed? Yes  Patient Left Without Being Seen? No data recorded Reason for Not Completing Assessment: No data recorded  Collateral Involvement: none   Does Patient Have a Court Appointed Legal Guardian? No data recorded Name and Contact of Legal Guardian: No data recorded If  Minor and Not Living with Parent(s), Who has Custody? No data recorded Is CPS involved or ever been involved? Never  Is APS involved or ever been involved? Never   Patient Determined To Be At Risk for Harm To Self or Others Based on Review of Patient Reported Information or Presenting Complaint? Yes, for Self-Harm  Method: No data recorded Availability of Means: No data recorded Intent: No data recorded Notification Required: No data recorded Additional Information for Danger to Others Potential: No data recorded Additional Comments for Danger to Others Potential: No data recorded Are There Guns or Other Weapons in Your Home? No data recorded Types of Guns/Weapons: No data recorded Are These Weapons Safely Secured?                            No data recorded Who Could Verify You Are Able To Have These Secured: No data recorded Do You Have any Outstanding Charges, Pending Court Dates, Parole/Probation? No data recorded Contacted To Inform of Risk of Harm To Self or Others: No data recorded  Location of Assessment: [facility] ED   Does Patient Present under Involuntary Commitment? No  IVC Papers Initial File Date: No data recorded  [place] of Residence: [place]   Patient Currently Receiving the Following Services: Not Receiving Services   Determination of Need: No data recorded  Options For Referral: Inpatient Hospitalization     CCA Biopsychosocial Intake/Chief Complaint:  Ronnie Hoover is a 34 yo male currently in [place] after recent suicide attempt. Pt attempted to take pills and was stopped by a friend and brought to the hospital.  Pt reports that he has been feeling very sad and down recently--thinks about his kids and not being able to spend time with them. Pt reports that he has had suicidal ideation in the past, and has had previous attempts: cutting wrist with knife, attempting to strangle self. Pt reports that a recent break up was the trigger for this last suicidal attempt. Pt  denies any HI and states that he often hears voices telling him to hurt himself. Pt is supported by his sister "she won't let me out of her sight", and this is a comfort to him. Pt reports that he has been feeling very down, depressed, and hopeless for the past few months. Pt also reports experiencing some anxiety symptoms. Pt reports that he is willing to do what he needs to do to get the help he needs. Pt very emotional and tearful throughout assessment. Pt feels that he is a danger to himself at time of assessment.  Current Symptoms/Problems: depression, suicidal ideation   Patient Reported Schizophrenia/Schizoaffective Diagnosis in Past: No   Strengths: family support; friend support  Preferences: No data recorded Abilities: enjoys music   Type of Services Patient Feels are Needed: psychiatric services   Initial Clinical Notes/Concerns: No data recorded  Mental Health Symptoms Depression:  Change in energy/activity; Difficulty Concentrating; Tearfulness; Sleep (too much or little); Weight gain/loss; Fatigue; Worthlessness; Hopelessness; Increase/decrease in appetite; Irritability   Duration of Depressive symptoms: Greater than two weeks   Mania:  Racing thoughts   Anxiety:   Worrying; Tension; Sleep; Restlessness; Irritability; Fatigue; Difficulty concentrating  Psychosis:  Hallucinations (pt hearing voices)   Duration of Psychotic symptoms: Greater than six months   Trauma:  None   Obsessions:  None   Compulsions:  None   Inattention:  None   Hyperactivity/Impulsivity:  N/A   Oppositional/Defiant Behaviors:  None   Emotional Irregularity:  Mood lability   Other Mood/Personality Symptoms:  No data recorded   Mental Status Exam Appearance and self-care  Stature:  Tall   Weight:  Average weight   Clothing:  Neat/clean   Grooming:  Normal   Cosmetic use:  None   Posture/gait:  Normal   Motor activity:  Not Remarkable   Sensorium  Attention:  Normal    Concentration:  Normal   Orientation:  X5   Recall/memory:  Normal   Affect and Mood  Affect:  Anxious; Tearful; Depressed   Mood:  Depressed; Anxious   Relating  Eye contact:  Normal   Facial expression:  Anxious; Depressed   Attitude toward examiner:  Cooperative   Thought and Language  Speech flow: Clear and Coherent   Thought content:  Appropriate to Mood and Circumstances   Preoccupation:  Suicide   Hallucinations:  Auditory   Organization:  No data recorded  [employer] ServicesExecutive Functions  Fund of Knowledge:  Fair   Intelligence:  Average   Abstraction:  Normal   Judgement:  Fair   Dance movement psychotherapisteality Testing:  Variable   Insight:  Gaps   Decision Making:  Impulsive   Social Functioning  Social Maturity:  Impulsive   Social Judgement:  Normal   Stress  Stressors:  Relationship   Coping Ability:  Contractorxhausted   Skill Deficits:  None   Supports:  Family; Friends/Service system     Religion:    Leisure/Recreation: Leisure / Recreation Do You Have Hobbies?: Yes Leisure and Hobbies: enjoys music  Exercise/Diet: Exercise/Diet Do You Exercise?: No (wants to exercise more) Have You Gained or Lost A Significant Amount of Weight in the Past Six Months?: Yes-Lost Number of Pounds Lost?: 10 Do You Follow a Special Diet?: No Do You Have Any Trouble Sleeping?: Yes Explanation of Sleeping Difficulties: has not slept in 3 days   CCA Employment/Education Employment/Work Situation:    Education: Education Is Patient Currently Attending School?: No Did You Have Any Difficulty At School?: No Patient's Education Has Been Impacted by Current Illness: No   CCA Family/Childhood History Family and Relationship History: Family history Does patient have children?: Yes How many children?: 2 How is patient's relationship with their children?: pt distressed over estranged relationship with children  Childhood History:  Childhood History By whom was/is the patient  raised?: Mother Additional childhood history information: stable childhood Description of patient's relationship with caregiver when they were a child: stable Patient's description of current relationship with people who raised him/her: stable Does patient have siblings?: Yes Description of patient's current relationship with siblings: good relationship with sister Did patient suffer any verbal/emotional/physical/sexual abuse as a child?: No Did patient suffer from severe childhood neglect?: No Has patient ever been sexually abused/assaulted/raped as an adolescent or adult?: No Was the patient ever a victim of a crime or a disaster?: No Witnessed domestic violence?: No Has patient been affected by domestic violence as an adult?: No  Child/Adolescent Assessment:     CCA Substance Use Alcohol/Drug Use: Alcohol / Drug Use History of alcohol / drug use?: Yes Substance #1 Name of Substance 1: etoh 1 - Amount (size/oz): 3 cans 1 - Frequency: daily Substance #2 Name of Substance 2:  marijuana 2 - Amount (size/oz): blunt 2 - Frequency: multiple times per day every day     ASAM's:  Six Dimensions of Multidimensional Assessment  Dimension 1:  Acute Intoxication and/or Withdrawal Potential:   Dimension 1:  Description of individual's past and current experiences of substance use and withdrawal: regular etoh and marijuana use  Dimension 2:  Biomedical Conditions and Complications:      Dimension 3:  Emotional, Behavioral, or Cognitive Conditions and Complications:     Dimension 4:  Readiness to Change:     Dimension 5:  Relapse, Continued use, or Continued Problem Potential:     Dimension 6:  Recovery/Living Environment:     ASAM Severity Score: ASAM's Severity Rating Score: 5  ASAM Recommended Level of Treatment:     Substance use Disorder (SUD)    Recommendations for Services/Supports/Treatments:    DSM5 Diagnoses: Patient Active Problem List   Diagnosis Date Noted    Perianal abscess 03/20/2017    Referrals to Alternative Service(s): Referred to Alternative Service(s):   Place:   Date:   Time:    Referred to Alternative Service(s):   Place:   Date:   Time:    Referred to Alternative Service(s):   Place:   Date:   Time:    Referred to Alternative Service(s):   Place:   Date:   Time:     Ernest Haber Hendrick Pavich, LCSW

## 2020-05-24 ENCOUNTER — Encounter (HOSPITAL_COMMUNITY): Payer: Self-pay | Admitting: Registered Nurse

## 2020-05-24 DIAGNOSIS — F332 Major depressive disorder, recurrent severe without psychotic features: Secondary | ICD-10-CM | POA: Diagnosis present

## 2020-05-24 DIAGNOSIS — R45851 Suicidal ideations: Secondary | ICD-10-CM

## 2020-05-24 LAB — RAPID URINE DRUG SCREEN, HOSP PERFORMED
Amphetamines: NOT DETECTED
Barbiturates: NOT DETECTED
Benzodiazepines: NOT DETECTED
Cocaine: NOT DETECTED
Opiates: NOT DETECTED
Tetrahydrocannabinol: POSITIVE — AB

## 2020-05-24 LAB — RESP PANEL BY RT-PCR (FLU A&B, COVID) ARPGX2
Influenza A by PCR: NEGATIVE
Influenza B by PCR: NEGATIVE
SARS Coronavirus 2 by RT PCR: NEGATIVE

## 2020-05-24 MED ORDER — SERTRALINE HCL 25 MG PO TABS: 50.0000 mg | ORAL_TABLET | Freq: Every day | ORAL | 0 refills | Status: DC

## 2020-05-24 MED ORDER — SERTRALINE HCL 50 MG PO TABS
25.0000 mg | ORAL_TABLET | Freq: Every day | ORAL | Status: DC
Start: 2020-05-24 — End: 2020-05-24
  Administered 2020-05-24: 25 mg via ORAL
  Filled 2020-05-24: qty 1

## 2020-05-24 NOTE — ED Provider Notes (Signed)
Emergency Medicine Observation Re-evaluation Note  MICK TANGUMA is a 34 y.o. male, seen on rounds today.  Pt initially presented to the ED for complaints of Suicidal Currently, the patient is calm and interactive.  Physical Exam  BP 123/74 (BP Location: Left Arm)   Pulse 61   Temp 97.8 F (36.6 C) (Oral)   Resp 16   Ht 6\' 1"  (1.854 m)   Wt 56.7 kg   SpO2 98%   BMI 16.49 kg/m  Physical Exam General: Comfortable Cardiac: Normal heart rate Lungs: No respiratory distress Psych: Calm, cooperative, not responding to internal stimuli  ED Course / MDM  EKG:    I have reviewed the labs performed to date as well as medications administered while in observation.  Recent changes in the last 24 hours include he has been calm and cooperative and has improved.  He is no longer actively suicidal.  Plan  Current plan is for discharge with outpatient psychiatric care. Patient is not under full IVC at this time.  Prescription for Zoloft, sent to his pharmacy, by me   , MD 05/24/20 1325

## 2020-05-24 NOTE — ED Notes (Addendum)
Pt belongings placed in triage nursing station. Pt has 2 belonging bags

## 2020-05-24 NOTE — Consult Note (Signed)
Telepsych Consultation   Reason for Consult:  Suicidal ideation Referring Physician:  Jesusita OkaAberman, Caroline C, PA-C [facility] Location of Provider: Other: [facility]  Patient Identification: Ronnie QuaWilliam T Leeb MRN:  [id number] Principal Diagnosis: MDD (major depressive disorder), recurrent severe, without psychosis (HCC) Diagnosis:  Principal Problem:   MDD (major depressive disorder), recurrent severe, without psychosis (HCC)   Total Time spent with patient: 30 minutes  Subjective:   Ronnie Hoover is a 34 y.o. male patient [facility] after presenting with complaints of suicidal ideation and plan to overdose on Xanax but was stopped by a friend.  HPI:  Ronnie Hoover, 34 y.o., male patient seen via tele psych by this provider, consulted with Dr. Lucianne MussKumar; and chart reviewed on 05/24/20.  On evaluation Ronnie Hoover reports he is feeling better today but was feeling really down yesterday.  Reports he has had depression for a while and states his stressor is "I hurt in my heart.  I don't feel loved.  I just don't feel loved or as if I'm alone.  I know that's not true.  I don't know if it was so bad yesterday because I hadn't had any rest or hadn't slept."  Patient states he has self harmed in the past by chocking self "When I was younger my mother never took me to get any help because she said I was doing it to get attention.  Now my family thinking my depression is because of the break up with my girlfriend in November; but it's not.  I have a lot of things going on and I can't really talk to my family about it because they don't listen or understand."  Patient states he is able to go to his sister and ask for help.  Patient states [facility] but was not offered psychiatric services "At that time I was depressed because I wasn't in my daughters life but after the interview they just let me leave and never got back in touch."  Patient states he is living with  his sister who is supportive; states that his daughter is in his life not but has a 768 yr old son that he has never seen which is also a stressor.  Patient also states that he is employed.  Patient informs that he doesn't want to die but needs help wanting to start medication for depression; stating he has never taken a psychotropic before.  Patient also stating he wants therapy.  Patient denies prior psychiatric hospitalization.     During evaluation Ronnie Hoover is sitting up in chair in no acute distress.  He is alert, oriented x 4, calm and cooperative.  His mood is depressed and congruent affect.  He does not appear to be responding to internal/external stimuli or delusional thoughts.  At this time patient denies suicidal/self-harm/homicidal ideation, psychosis, and paranoia.  Patient answered question appropriately.  Patient gave permission to speak to his sister for Collateral information Gayleen OremQuadicia White at [phone]  Collateral Information:  Spoke to patients sister Gayleen OremQuadicia White at [phone].  Ms Cliffton AstersWhite states that she feels that her brother (patient) is safe to come home.  States that he does live with her and he is and has came to her before when he is feeling down.  States that he has told her that he needed help and wanted to get set up with outpatient psychiatric services.  Ms. Cliffton AstersWhite states that she would like for  the services to be set up prior to patient being discharged home.  States she feels he needs to be started on medication also.  Wants patient to call her when he is ready to be discharged home.   Past Psychiatric History: Reports he has been suffering from depression for years.  History of self harm by choking, and has cut his wrist once  Risk to Self:  No Risk to Others:  No Prior Inpatient Therapy:  No Prior Outpatient Therapy:  No  Past Medical History:  Past Medical History:  Diagnosis Date  . Abscess   . Bronchitis     Past Surgical History:  Procedure  Laterality Date  . IRRIGATION AND DEBRIDEMENT ABSCESS Right 03/20/2017   Procedure: IRRIGATION AND DEBRIDEMENT ABSCESS , EUA, DRAIN PLACEMENT;  Surgeon: Andria Meuse, MD;  Location: [facility] OR;  Service: General;  Laterality: Right;   Family History:  Family History  Family history unknown: Yes   Family Psychiatric  History: Unaware Social History:  Social History   Substance and Sexual Activity  Alcohol Use Yes   Comment: occasionally     Social History   Substance and Sexual Activity  Drug Use Yes  . Types: Marijuana    Social History   Socioeconomic History  . Marital status: Single    Spouse name: Not on file  . Number of children: Not on file  . Years of education: Not on file  . Highest education level: Not on file  Occupational History  . Not on file  Tobacco Use  . Smoking status: Current Every Day Smoker    Packs/day: 1.00    Types: Cigarettes  . Smokeless tobacco: Never Used  Vaping Use  . Vaping Use: Never used  Substance and Sexual Activity  . Alcohol use: Yes    Comment: occasionally  . Drug use: Yes    Types: Marijuana  . Sexual activity: Not on file  Other Topics Concern  . Not on file  Social History Narrative  . Not on file   Social Determinants of Health   Financial Resource Strain: Not on file  Food Insecurity: Not on file  Transportation Needs: Not on file  Physical Activity: Not on file  Stress: Not on file  Social Connections: Not on file   Additional Social History:    Allergies:   Allergies  Allergen Reactions  . Banana Anaphylaxis and Nausea And Vomiting  . Tomato Anaphylaxis and Hives    Labs:  Results for orders placed or performed during the hospital encounter of 05/23/20 (from the past 48 hour(s))  Resp Panel by RT-PCR (Flu A&B, Covid) Nasopharyngeal Swab     Status: None   Collection Time: 05/23/20  1:36 AM   Specimen: Nasopharyngeal Swab; Nasopharyngeal(NP) swabs in vial transport medium  Result Value Ref  Range   SARS Coronavirus 2 by RT PCR NEGATIVE NEGATIVE    Comment: (NOTE) SARS-CoV-2 target nucleic acids are NOT DETECTED.  The SARS-CoV-2 RNA is generally detectable in upper respiratory specimens during the acute phase of infection. The lowest concentration of SARS-CoV-2 viral copies this assay can detect is 138 copies/mL. A negative result does not preclude SARS-Cov-2 infection and should not be used as the sole basis for treatment or other patient management decisions. A negative result may occur with  improper specimen collection/handling, submission of specimen other than nasopharyngeal swab, presence of viral mutation(s) within the areas targeted by this assay, and inadequate number of viral copies(<138 copies/mL). A negative result must  be combined with clinical observations, patient history, and epidemiological information. The expected result is Negative.  Fact Sheet for Patients:  BloggerCourse.com  Fact Sheet for Healthcare Providers:  SeriousBroker.it  This test is no t yet approved or cleared by the [place] FDA and  has been authorized for detection and/or diagnosis of SARS-CoV-2 by FDA under an Emergency Use Authorization (EUA). This EUA will remain  in effect (meaning this test can be used) for the duration of the COVID-19 declaration under Section 564(b)(1) of the Act, 21 U.S.C.section 360bbb-3(b)(1), unless the authorization is terminated  or revoked sooner.       Influenza A by PCR NEGATIVE NEGATIVE   Influenza B by PCR NEGATIVE NEGATIVE    Comment: (NOTE) The Xpert Xpress SARS-CoV-2/FLU/RSV plus assay is intended as an aid in the diagnosis of influenza from Nasopharyngeal swab specimens and should not be used as a sole basis for treatment. Nasal washings and aspirates are unacceptable for Xpert Xpress SARS-CoV-2/FLU/RSV testing.  Fact Sheet for  Patients: BloggerCourse.com  Fact Sheet for Healthcare Providers: SeriousBroker.it  This test is not yet approved or cleared by the [place] FDA and has been authorized for detection and/or diagnosis of SARS-CoV-2 by FDA under an Emergency Use Authorization (EUA). This EUA will remain in effect (meaning this test can be used) for the duration of the COVID-19 declaration under Section 564(b)(1) of the Act, 21 U.S.C. section 360bbb-3(b)(1), unless the authorization is terminated or revoked.  Performed at [facility], [address], [place], [place] [address]   Rapid urine drug screen (hospital performed)     Status: Abnormal   Collection Time: 05/23/20  2:07 AM  Result Value Ref Range   Opiates NONE DETECTED NONE DETECTED   Cocaine NONE DETECTED NONE DETECTED   Benzodiazepines NONE DETECTED NONE DETECTED   Amphetamines NONE DETECTED NONE DETECTED   Tetrahydrocannabinol POSITIVE (A) NONE DETECTED   Barbiturates NONE DETECTED NONE DETECTED    Comment: (NOTE) DRUG SCREEN FOR MEDICAL PURPOSES ONLY.  IF CONFIRMATION IS NEEDED FOR ANY PURPOSE, NOTIFY LAB WITHIN 5 DAYS.  LOWEST DETECTABLE LIMITS FOR URINE DRUG SCREEN Drug Class                     Cutoff (ng/mL) Amphetamine and metabolites    1000 Barbiturate and metabolites    200 Benzodiazepine                 200 Tricyclics and metabolites     300 Opiates and metabolites        300 Cocaine and metabolites        300 THC                            50 Performed at [facility], [address], [place], [place] [address]   Comprehensive metabolic panel     Status: Abnormal   Collection Time: 05/23/20  6:17 PM  Result Value Ref Range   Sodium 138 135 - 145 mmol/L   Potassium 4.2 3.5 - 5.1 mmol/L   Chloride 106 98 - 111 mmol/L   CO2 24 22 - 32 mmol/L   Glucose, Bld 103 (H) 70 - 99 mg/dL    Comment: Glucose reference range applies  only to samples taken after fasting for at least 8 hours.   BUN 7 6 - 20 mg/dL   Creatinine, Ser 8.29 0.61 - 1.24 mg/dL   Calcium  9.5 8.9 - 10.3 mg/dL   Total Protein 8.1 6.5 - 8.1 g/dL   Albumin 4.3 3.5 - 5.0 g/dL   AST 22 15 - 41 U/L   ALT 18 0 - 44 U/L   Alkaline Phosphatase 57 38 - 126 U/L   Total Bilirubin 0.6 0.3 - 1.2 mg/dL   GFR, Estimated >12 >87 mL/min    Comment: (NOTE) Calculated using the CKD-EPI Creatinine Equation (2021)    Anion gap 8 5 - 15    Comment: Performed at [facility], [address] [place], [place] [address]  Ethanol     Status: None   Collection Time: 05/23/20  6:17 PM  Result Value Ref Range   Alcohol, Ethyl (B) <10 <10 mg/dL    Comment: (NOTE) Lowest detectable limit for serum alcohol is 10 mg/dL.  For medical purposes only. Performed at [facility], [address], [place], [place] [address]   Salicylate level     Status: Abnormal   Collection Time: 05/23/20  6:17 PM  Result Value Ref Range   Salicylate Lvl <7.0 (L) 7.0 - 30.0 mg/dL    Comment: Performed at [facility], [address] [place], [place] [address]  Acetaminophen level     Status: Abnormal   Collection Time: 05/23/20  6:17 PM  Result Value Ref Range   Acetaminophen (Tylenol), Serum <10 (L) 10 - 30 ug/mL    Comment: (NOTE) Therapeutic concentrations vary significantly. A range of 10-30 ug/mL  may be an effective concentration for many patients. However, some  are best treated at concentrations outside of this range. Acetaminophen concentrations >150 ug/mL at 4 hours after ingestion  and >50 ug/mL at 12 hours after ingestion are often associated with  toxic reactions.  Performed at [facility], [address] [place], [place] [address]   cbc     Status: Abnormal   Collection Time: 05/23/20  6:17 PM  Result Value Ref Range   WBC 7.2 4.0 - 10.5 K/uL   RBC 4.86 4.22 - 5.81 MIL/uL   Hemoglobin  16.4 13.0 - 17.0 g/dL   HCT 47.6 54.6 - 50.3 %   MCV 100.8 (H) 80.0 - 100.0 fL   MCH 33.7 26.0 - 34.0 pg   MCHC 33.5 30.0 - 36.0 g/dL   RDW 54.6 56.8 - 12.7 %   Platelets 271 150 - 400 K/uL   nRBC 0.0 0.0 - 0.2 %    Comment: Performed at [facility], [address], [place], [place] [address]    Medications:  Current Facility-Administered Medications  Medication Dose Route Frequency Provider Last Rate Last Admin  . acetaminophen (TYLENOL) tablet 650 mg  650 mg Oral Once Aberman, Caroline C, PA-C      . sertraline (ZOLOFT) tablet 25 mg  25 mg Oral Daily Zakaree Mcclenahan B, NP       Current Outpatient Medications  Medication Sig Dispense Refill  . cyclobenzaprine (FLEXERIL) 5 MG tablet Take 1-2 tablets (5-10 mg total) by mouth 2 (two) times daily as needed for muscle spasms. 24 tablet 0  . traMADol (ULTRAM) 50 MG tablet Take 1 tablet (50 mg total) by mouth every 12 (twelve) hours as needed. (Patient taking differently: Take 50 mg by mouth every 12 (twelve) hours as needed for moderate pain.) 12 tablet 0  . amoxicillin (AMOXIL) 500 MG capsule Take 2 capsules (1,000 mg total) by mouth 2 (two) times daily. (Patient not taking: Reported on  05/23/2020) 40 capsule 0  . predniSONE (STERAPRED UNI-PAK 21 TAB) 10 MG (21) TBPK tablet 6 tabs for 1 day, then 5 tabs for 1 das, then 4 tabs for 1 day, then 3 tabs for 1 day, 2 tabs for 1 day, then 1 tab for 1 day (Patient not taking: Reported on 05/23/2020) 21 tablet 0    Musculoskeletal: Strength & Muscle Tone: within normal limits Gait & Station: normal Patient leans: N/A  Psychiatric Specialty Exam: Physical Exam Vitals and nursing note reviewed. Chaperone present: Sitter at bedside.  Constitutional:      General: He is not in acute distress.    Appearance: Normal appearance. He is not ill-appearing.  HENT:     Head: Normocephalic.  Eyes:     Pupils: Pupils are equal, round, and reactive to light.  Cardiovascular:     Rate  and Rhythm: Normal rate.  Pulmonary:     Effort: Pulmonary effort is normal.  Musculoskeletal:        General: Normal range of motion.     Cervical back: Normal range of motion.  Neurological:     Mental Status: He is alert and oriented to person, place, and time.  Psychiatric:        Attention and Perception: Attention and perception normal. He does not perceive auditory or visual hallucinations.        Mood and Affect: Affect normal. Mood is depressed.        Speech: Speech normal.        Behavior: Behavior normal. Behavior is cooperative.        Thought Content: Thought content normal. Thought content is not paranoid or delusional. Thought content does not include homicidal or suicidal ideation.        Cognition and Memory: Cognition and memory normal.        Judgment: Judgment normal.     Review of Systems  Constitutional: Negative.   HENT: Negative.   Eyes: Negative.   Respiratory: Negative.   Cardiovascular: Negative.   Gastrointestinal: Negative.   Genitourinary: Negative.   Musculoskeletal: Negative.   Skin: Negative.   Neurological: Negative.   Hematological: Negative.   Psychiatric/Behavioral: Negative for agitation, behavioral problems, confusion, dysphoric mood, hallucinations and sleep disturbance. Self-injury: History of choking self when younger. Suicidal ideas: Denies at this time. The patient is not hyperactive. Nervous/anxious: Stable.        Patient able to contract for safety and safety plan established     Blood pressure 123/74, pulse 61, temperature 97.8 F (36.6 C), temperature source Oral, resp. rate 16, height 6\' 1"  (1.854 m), weight 56.7 kg, SpO2 98 %.Body mass index is 16.49 kg/m.  General Appearance: Casual  Eye Contact:  Good  Speech:  Clear and Coherent and Normal Rate  Volume:  Normal  Mood:  Depressed  Affect:  Appropriate and Congruent  Thought Process:  Coherent, Goal Directed and Descriptions of Associations: Intact  Orientation:  Full  (Time, Place, and Person)  Thought Content:  WDL  Suicidal Thoughts:  No  Homicidal Thoughts:  No  Memory:  Immediate;   Good Recent;   Good  Judgement:  Intact  Insight:  Present  Psychomotor Activity:  Normal  Concentration:  Concentration: Good and Attention Span: Good  Recall:  Good  Fund of Knowledge:  Good  Language:  Good  Akathisia:  No  Handed:  Right  AIMS (if indicated):     Assets:  Communication Skills Desire for Improvement Housing Leisure Time Physical Health  Social Support  ADL's:  Intact  Cognition:  WNL  Sleep:      Safety Plan established Patient living with sister who is with him most of the time; if suicidal thoughts worsen of become active will go to his sister, friend, call mobile crisis or 911 before he acts.  Patient will follow up with PHP/IOP program Zoloft prescribed for major depression:  Medicating discussed and literature given  The suicide prevention education provided includes the following:  Suicide risk factors  Suicide prevention and interventions  National Suicide Hotline telephone number  [facility] assessment telephone number  [facility] Emergency Assistance 911  [facility] and/or Residential Mobile Crisis Unit telephone number   Request made of family/significant other to:  Remove weapons (e.g., guns, rifles, knives), all items previously/currently identified as safety concern.   Remove drugs/medications (over the counter, prescriptions, illicit drugs), all items previously/currently identified as a safety concern.   Treatment Plan Summary: Plan Psychiatrically Clear.  Behavioral Health Coordnatior will set up outpatient psychiatric servies for IOP or PHP  Disposition: No evidence of imminent risk to self or others at present.   Patient does not meet criteria for psychiatric inpatient admission. Supportive therapy provided about ongoing stressors. Refer to IOP. Discussed crisis plan, support from  social network, calling 911, coming to the Emergency Department, and calling Suicide Hotline.  This service was provided via telemedicine using a 2-way, interactive audio and video technology.  Names of all persons participating in this telemedicine service and their role in this encounter. Name: Assunta Found Role: NP  Name: Dr. Nelly Rout Role: Psychiatrist  Name: Ronnie Hoover Role: Patient  Name: Gayleen Orem Role: Patients sister    Spoke to Dr. Effie Shy via telephone and informed of disposition.  Also patient needing prescription for Zoloft 25 mg daily 30 tabs.  One tablet was ordered to be given prior to discharged and patient to be monitored to make sure no adverse/allergic reaction and discharged home with follow up services.      Cortni Tays, NP 05/24/2020 10:03 AM

## 2020-05-24 NOTE — Discharge Instructions (Signed)
For your behavioral health needs, you are advised to follow up with the Partial Hospitalization Program at [facility].  This program meets from 9:00 am - 1:00 pm, Monday - Friday.  You have been scheduled for an intake appointment on Monday, May 29, 2020 at 1:30 pm.  Milana Na will be sending you an invitation by e-mail that morning.       [facility]      [address]      [place], [place] [address]      Contact person: Milana Na, [facility]      [phone]    Helping Someone Who Is Suicidal Suicide is the act of ending (taking) one's own life. Someone who is thinking about suicide needs immediate help. Even if you do not know what to say or do to help, you can start by letting the person know that you care. Listen to him or her. Then talk about how to get help. Help is available through suicide hotlines, therapy, and other treatments. What are signs that someone is suicidal? Common signs include:  Signs of depression, such as: ? Tearfulness or sadness. ? Irritability or rage. ? Problems with eating or sleeping. ? Feeling guilty or worthless. ? Loss of interest in things that a person used to enjoy. ? Feelings of hopelessness or helplessness. ? Recurrent thoughts of death or suicide.  Changes in social behaviors and relationships, including: ? Isolating oneself. ? Withdrawing from friends and family. ? Giving away possessions. ? Saying goodbye. ? Acting aggressively. ? Sleeping more or less than usual. ? Having trouble managing school or work. ? Talking about feeling hopeless or being a burden. ? Engaging in risky behaviors, such as drinking more alcohol or using more drugs. What are the risk factors for suicide? Risk factors for suicide include:  Having a friend or family member who has died by suicide.  A history of attempted suicide.  Depression or other mental health problems.  Being exposed to graphic stories of suicide  in the media.  Alcohol or drug abuse, especially when combined with a mental illness.  A serious physical problem, such as chronic pain.  A stressful life event, now or in the past, such as: ? Divorce or social rejection. ? Childhood abuse or neglect. ? Sudden life changes, such as financial crisis or going to jail. What should I do if someone is suicidal? If you think someone may be suicidal:  Ask him or her directly: "Are you thinking about suicide or hurting yourself?" Asking that question does not make someone more likely to make a suicide attempt.  Avoid giving advice or arguing with the person about the value of his or her life. If a person confides in you that he or she is considering suicide:  Take the person seriously. Never ignore comments about suicide.  Listen to the person's thoughts and concerns with compassion.  Let the person know that you will stay with him or her.  Offer to help the person get to a doctor or mental health professional.  Remove all weapons and medicines from the person's living area.  Do not promise to keep his or her thoughts of suicide a secret.  Call a suicide crisis helpline, such as the National Suicide Prevention Lifeline at 640-308-9910 or text TALK to 7276422035. Get help right away if: You believe that a person is in immediate danger of hurting himself or herself, or may have thoughts of taking his or  her own life. You can:  Call a crisis center or a local suicide prevention center. These are often located at hospitals, clinics, [employer] service organizations, social service providers, or health departments.  Call a suicide crisis helpline, such as the National Suicide Prevention Lifeline at 86503167131-873-288-1045, or text TALK to 337-244-5493741741. This is open 24 hours a day.  Take the person to the nearest emergency department.  Call your local emergency services (911 in the [place]  [facility] health and human services helpline (211 in the  [place] Summary  Suicide is the act of ending (taking) one's own life.  Suicide can be prevented by knowing the signs and taking action.  If you know someone who is showing risk factors for suicide, ask if he or she is thinking about hurting himself or herself. Take all concerns about suicide seriously, and get support from experts in mental illness or suicide.  If you believe that a person is in immediate danger of hurting himself or herself, or may have thoughts of taking his or her own life, get help right away. This information is not intended to replace advice given to you by your health care provider. Make sure you discuss any questions you have with your health care provider. Document Revised: 02/03/2019 Document Reviewed: 03/14/2017 Elsevier Patient Education  2020 Elsevier Inc.   Suicidal Feelings: How to Help Yourself Suicide is when you end your own life. There are many things you can do to help yourself feel better when struggling with these feelings. Many services and people are available to support you and others who struggle with similar feelings.  If you ever feel like you may hurt yourself or others, or have thoughts about taking your own life, get help right away. To get help:  Call your local emergency services (911 in the [place]  [facility] health and human services helpline (211 in the [place]  Go to your nearest emergency department.  Call a suicide hotline to speak with a trained counselor. The following suicide hotlines are available in the [place]: ? 1-800-273-TALK 470-740-4783(1-873-288-1045). ? 1-800-SUICIDE 820-464-3704(1-902-194-7979). ? 936 784 18661-475-015-2442. This is a hotline for Spanish speakers. ? (612)615-89871-2104392956. This is a hotline for TTY users. ? 1-866-4-U-TREVOR (364)423-4338(1-(704) 105-8395). This is a hotline for lesbian, gay, bisexual, transgender, or questioning youth. ? For a list of hotlines in [place], visit  ItCheaper.dkwww.suicide.org/hotlines/international/[place]-suicide-hotlines.html  Contact a crisis center or a local suicide prevention center. To find a crisis center or suicide prevention center: ? Call your local hospital, clinic, community service organization, mental health center, social service provider, or health department. Ask for help with connecting to a crisis center. ? For a list of crisis centers in the [place], visit: suicidepreventionlifeline.org ? For a list of crisis centers in [place], visit: suicideprevention.ca How to help yourself feel better   Promise yourself that you will not do anything extreme when you have suicidal feelings. Remember, there is hope. Many people have gotten through suicidal thoughts and feelings, and you can too. If you have had these feelings before, remind yourself that you can get through them again.  Let family, friends, teachers, or counselors know how you are feeling. Try not to separate yourself from those who care about you and want to help you. Talk with someone every day, even if you do not feel sociable. Face-to-face conversation is best to help them understand your feelings.  Contact a mental health care provider and work with this person regularly.  Make a safety plan  that you can follow during a crisis. Include phone numbers of suicide prevention hotlines, mental health professionals, and trusted friends and family members you can call during an emergency. Save these numbers on your phone.  If you are thinking of taking a lot of medicine, give your medicine to someone who can give it to you as prescribed. If you are on antidepressants and are concerned you will overdose, tell your health care provider so that he or she can give you safer medicines.  Try to stick to your routines. Follow a schedule every day. Make self-care a priority.  Make a list of realistic goals, and cross them off when you achieve them. Accomplishments can give you a sense  of worth.  Wait until you are feeling better before doing things that you find difficult or unpleasant.  Do things that you have always enjoyed to take your mind off your feelings. Try reading a book, or listening to or playing music. Spending time outside, in nature, may help you feel better. Follow these instructions at home:   Visit your primary health care provider every year for a checkup.  Work with a mental health care provider as needed.  Eat a well-balanced diet, and eat regular meals.  Get plenty of rest.  Exercise if you are able. Just 30 minutes of exercise each day can help you feel better.  Take over-the-counter and prescription medicines only as told by your health care provider. Ask your mental health care provider about the possible side effects of any medicines you are taking.  Do not use alcohol or drugs, and remove these substances from your home.  Remove weapons, poisons, knives, and other deadly items from your home. General recommendations  Keep your living space well lit.  When you are feeling well, write yourself a letter with tips and support that you can read when you are not feeling well.  Remember that life's difficulties can be sorted out with help. Conditions can be treated, and you can learn behaviors and ways of thinking that will help you. Where to find more information  National Suicide Prevention Lifeline: www.suicidepreventionlifeline.org  Hopeline: www.hopeline.com  [employer] for Suicide Prevention: https://www.ayers.com/  [employer] (for lesbian, gay, bisexual, transgender, or questioning youth): www.thetrevorproject.org Contact a health care provider if:  You feel as though you are a burden to others.  You feel agitated, angry, vengeful, or have extreme mood swings.  You have withdrawn from family and friends. Get help right away if:  You are talking about suicide or wishing to die.  You start making plans for how to  commit suicide.  You feel that you have no reason to live.  You start making plans for putting your affairs in order, saying goodbye, or giving your possessions away.  You feel guilt, shame, or unbearable pain, and it seems like there is no way out.  You are frequently using drugs or alcohol.  You are engaging in risky behaviors that could lead to death. If you have any of these symptoms, get help right away. Call emergency services, go to your nearest emergency department or crisis center, or call a suicide crisis helpline. Summary  Suicide is when you take your own life.  Promise yourself that you will not do anything extreme when you have suicidal feelings.  Let family, friends, teachers, or counselors know how you are feeling.  Get help right away if you feel as though life is getting too tough to handle and you  are thinking about suicide. This information is not intended to replace advice given to you by your health care provider. Make sure you discuss any questions you have with your health care provider. Document Revised: 09/03/2018 Document Reviewed: 12/24/2016 Elsevier Patient Education  2020 [employer].

## 2020-05-24 NOTE — ED Notes (Signed)
Pt visualized in room, sleeping, equal chest rise noted.  Sitter at bedside.

## 2020-05-29 ENCOUNTER — Ambulatory Visit (HOSPITAL_COMMUNITY): Payer: Medicaid Other | Admitting: Professional

## 2020-05-29 ENCOUNTER — Other Ambulatory Visit: Payer: Self-pay

## 2020-05-29 DIAGNOSIS — F333 Major depressive disorder, recurrent, severe with psychotic symptoms: Secondary | ICD-10-CM

## 2020-05-29 NOTE — Psych (Signed)
Virtual Visit via Video Note  I connected with Ronnie Hoover on 05/29/20 at  1:30 PM EST by a video enabled telemedicine application and verified that I am speaking with the correct person using two identifiers.  Location: Patient: Home Provider: Clinical Home Office   I discussed the limitations of evaluation and management by telemedicine and the availability of in person appointments. The patient expressed understanding and agreed to proceed.  Follow Up Instructions:    I discussed the assessment and treatment plan with the patient. The patient was provided an opportunity to ask questions and all were answered. The patient agreed with the plan and demonstrated an understanding of the instructions.   The patient was advised to call back or seek an in-person evaluation if the symptoms worsen or if the condition fails to improve as anticipated.  I provided 60 minutes of non-face-to-face time during this encounter.   [name], [facility]     Comprehensive Clinical Assessment (CCA) Note  05/29/2020 Ronnie Hoover [id number]  Chief Complaint:  Chief Complaint  Patient presents with  . 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 Hospital Discharge  Referral name: [facility]  Referral phone number: No data recorded  Whom do you see for routine medical problems? I don't have a doctor  Practice/Facility Name: No data recorded Practice/Facility Phone Number: No data recorded Name of Contact: No data recorded Contact Number: No data recorded Contact Fax Number: No data recorded Prescriber Name: No data recorded Prescriber Address (if known): No data recorded  What Is the Reason for Your Visit/Call Today? suicidal thoughts and attempted overdose (last Sunday); depressed a lot; not sleeping  How Long Has This Been  Causing You Problems? > than 6 months  What Do You Feel Would Help You the Most Today? Therapy   Have You Recently Been in Any Inpatient Treatment (Hospital/Detox/Crisis Center/28-Day Program)? Yes  Name/Location of Program/Hospital:[facility]  How Long Were You There? 24 hours  When Were You Discharged? No data recorded  Have You Ever Received Services From [facility] Before? Yes  Who Do You See at [facility]? yes- ED- surgery   Have You Recently Had Any Thoughts About Hurting Yourself? Yes (suicide attempt--tried to take pills)  Are You Planning to Commit Suicide/Harm Yourself At This time? Yes   Have you Recently Had Thoughts About Hurting Someone Karolee Ohs? No  Explanation: No data recorded  Have You Used Any Alcohol or Drugs in the Past 24 Hours? No  How Long Ago Did You Use Drugs or Alcohol? No data recorded What Did You Use and How Much? drank etoh and smoked marijuana   Do You Currently Have a Therapist/Psychiatrist? No  Name of Therapist/Psychiatrist: No data recorded  Have You Been Recently Discharged From Any Office Practice or Programs? No  Explanation of Discharge From Practice/Program: No data recorded    CCA Screening Triage Referral Assessment Type of Contact: Tele-Assessment  Is this Initial or Reassessment? Initial Assessment  Date Telepsych consult ordered in [facility]:  05/23/2020  [facility]   Patient Reported Information Reviewed? Yes  Patient Left Without Being Seen? No data recorded Reason for Not Completing Assessment: No data recorded  Collateral Involvement: none   Does Patient Have a Court Appointed Legal Guardian? No data recorded Name and Contact of Legal Guardian: No  data recorded If Minor and Not Living with Parent(s), Who has Custody? No data recorded Is CPS involved or ever been involved? Never  Is APS involved or ever been involved? Never   Patient Determined To Be At Risk for Harm To  Self or Others Based on Review of Patient Reported Information or Presenting Complaint? No  Method: No data recorded Availability of Means: No data recorded Intent: No data recorded Notification Required: No data recorded Additional Information for Danger to Others Potential: No data recorded Additional Comments for Danger to Others Potential: No data recorded Are There Guns or Other Weapons in Your Home? No data recorded Types of Guns/Weapons: No data recorded Are These Weapons Safely Secured?                            No data recorded Who Could Verify You Are Able To Have These Secured: No data recorded Do You Have any Outstanding Charges, Pending Court Dates, Parole/Probation? No data recorded Contacted To Inform of Risk of Harm To Self or Others: No data recorded  Location of Assessment: [facility] ED   Does Patient Present under Involuntary Commitment? No  IVC Papers Initial File Date: No data recorded  [place] of Residence: [place]   Patient Currently Receiving the Following Services: Not Receiving Services   Determination of Need: Routine (7 days)   Options For Referral: Partial Hospitalization; Outpatient Therapy; Medication Management  CCA Biopsychosocial Intake/Chief Complaint:  Pt is f/u with PHP after recent suicide attempt. Pt attempted to take pills and was stopped by a friend and taken to the hospital.  Pt reports that he has been feeling very sad and down recently--thinks about his kids and not being able to spend time with them; pt has not seen his 8yo son since birth and pt was in a relationship with a woman with children and felt like a father to those children until recent breakup. Pt reports that he has had suicidal ideation in the past and has had previous attempts: cutting wrist with knife in Aug 2021, attempting to strangle self. Pt reports that a recent break up was the trigger for suicidal attempt in Aug 2021. Pt denies any HI and states that he often hears voices  telling him "end your life, no one loves you, you shouldn't be here." Pt is supported by his sister and currently lives with her, her fianc, and her 2 kids ages 48 and 39. Pt reports that he has been feeling very down, depressed, and hopeless for the past few months. Pt also reports experiencing some anxiety symptoms. Pt reports that he is willing to do what he needs to do to get the help he needs.  Current Symptoms/Problems: depression, suicidal ideation; auditory hallucinations; anxiety; decreased appetite; decreased ability to sleep; increased isolation, anhedonia, and mood swings   Patient Reported Schizophrenia/Schizoaffective Diagnosis in Past: No   Strengths: family support; friend support  Preferences: to feel better  Abilities: enjoys music; can attend and participate in group   Type of Services Patient Feels are Needed: PHP   Initial Clinical Notes/Concerns: No data recorded  Mental Health Symptoms Depression:  Change in energy/activity; Difficulty Concentrating; Tearfulness; Sleep (too much or little); Weight gain/loss; Fatigue; Worthlessness; Hopelessness; Increase/decrease in appetite; Irritability   Duration of Depressive symptoms: Greater than two weeks   Mania:  Racing thoughts   Anxiety:   Worrying; Tension; Sleep; Restlessness; Irritability; Fatigue; Difficulty concentrating   Psychosis:  Hallucinations (pt  hearing voices telling him he's not worthy of living)   Duration of Psychotic symptoms: Greater than six months (Pt reports voices come and go throughout life)   Trauma:  None   Obsessions:  None   Compulsions:  None   Inattention:  None   Hyperactivity/Impulsivity:  N/A   Oppositional/Defiant Behaviors:  None   Emotional Irregularity:  Mood lability; Chronic feelings of emptiness; Unstable self-image   Other Mood/Personality Symptoms:  No data recorded   Mental Status Exam Appearance and self-care  Stature:  Tall   Weight:  Average weight    Clothing:  Casual   Grooming:  Normal   Cosmetic use:  None   Posture/gait:  Normal   Motor activity:  Not Remarkable   Sensorium  Attention:  Normal   Concentration:  Normal   Orientation:  X5   Recall/memory:  Normal   Affect and Mood  Affect:  Anxious; Depressed   Mood:  Depressed; Anxious   Relating  Eye contact:  Normal   Facial expression:  Anxious; Depressed   Attitude toward examiner:  Cooperative   Thought and Language  Speech flow: Clear and Coherent   Thought content:  Appropriate to Mood and Circumstances   Preoccupation:  Suicide   Hallucinations:  Auditory (Pt reports "deep, dark voices" telling him "end your life, no one loves you, you shouldn't be here")   Organization:  No data recorded  [employer] of Knowledge:  Fair   Intelligence:  Average   Abstraction:  Normal   Judgement:  Fair   Art therapist:  Variable   Insight:  Gaps   Decision Making:  Impulsive   Social Functioning  Social Maturity:  Impulsive   Social Judgement:  Normal   Stress  Stressors:  Relationship; Grief/losses; Housing; Museum/gallery curator; Transitions; Work   Coping Ability:  Advice worker Deficits:  None   Supports:  Family; Friends/Service system     Religion: Religion/Spirituality Are You A Religious Person?: Yes  Leisure/Recreation: Leisure / Recreation Do You Have Hobbies?: Yes Leisure and Hobbies: writing and making music  Exercise/Diet: Exercise/Diet Do You Exercise?: No (wants to exercise more) Have You Gained or Lost A Significant Amount of Weight in the Past Six Months?: Yes-Lost (Pt reports others have noticed the weight loss more so than himself) Number of Pounds Lost?: 10 Do You Follow a Special Diet?: No Do You Have Any Trouble Sleeping?: Yes Explanation of Sleeping Difficulties: Pt reports he only sees "darkness" when trying to sleep so he doesn't sleep for long periods of time   CCA  Employment/Education Employment/Work Situation: Employment / Work Situation Employment situation: Employed Where is patient currently employed?: Scientist, research (physical sciences) How long has patient been employed?: 2 weeks or so Patient's job has been impacted by current illness: Yes Describe how patient's job has been impacted: Pt does not want to go to work due to working alone and struggling with thoughts when alone; pt works 3rd shift from [place] patient ever been in the [employer]?: No  Education: Education Is Patient Currently Attending School?: No Last Grade Completed: 12 Name of Grace: [employer] Did Teacher, adult education From [employer]?: Yes Did Physicist, medical?: Yes What Type of College Degree Do you Have?: [place]; did not complete Did Heritage manager?: No Did You Have An Individualized Education Program (IIEP): No Did You Have Any Difficulty At School?: No Patient's Education Has Been Impacted by Current Illness: No   CCA Family/Childhood History Family and  Relationship History: Family history Marital status: Single Are you sexually active?: No What is your sexual orientation?: heterosexual Has your sexual activity been affected by drugs, alcohol, medication, or emotional stress?: no Does patient have children?: Yes How many children?: 2 How is patient's relationship with their children?: pt distressed over estranged relationship with children  Childhood History:  Childhood History By whom was/is the patient raised?: Mother/father and step-parent Additional childhood history information: Pt reports he was a "mama's boy", considered the "nerd" because he stayed inside and made good grades; bullied at school and at home for looks Description of patient's relationship with caregiver when they were a child: Good with Mom and stepdad Patient's description of current relationship with people who raised him/her: Good; they are ready to do more listening and helping; pt reports  he tried to get help for depression as a child and was told he was looking for attention and no help was given How were you disciplined when you got in trouble as a child/adolescent?: whoppings and things taken away Does patient have siblings?: Yes Number of Siblings: 4 Description of patient's current relationship with siblings: good relationship with 2 older and 2 younger siblings Did patient suffer any verbal/emotional/physical/sexual abuse as a child?: No Did patient suffer from severe childhood neglect?: No Has patient ever been sexually abused/assaulted/raped as an adolescent or adult?: No Was the patient ever a victim of a crime or a disaster?: No Witnessed domestic violence?: No Has patient been affected by domestic violence as an adult?: No  Child/Adolescent Assessment:     CCA Substance Use Alcohol/Drug Use: Alcohol / Drug Use Pain Medications: see MAR Prescriptions: see MAR Over the Counter: see MAR History of alcohol / drug use?: Yes Substance #1 Name of Substance 1: etoh 1 - Age of First Use: 20 1 - Amount (size/oz): 6 cans 1 - Frequency: daily 1 - Duration: years 1 - Last Use / Amount: 05/23/20 Substance #2 Name of Substance 2: marijuana 2 - Age of First Use: 17 2 - Amount (size/oz): 2-3 blunts; I smoked from sunup to sun down 2 - Frequency: multiple times per day every day 2 - Duration: years 2 - Last Use / Amount: 05/23/20                     ASAM's:  Six Dimensions of Multidimensional Assessment  Dimension 1:  Acute Intoxication and/or Withdrawal Potential:   Dimension 1:  Description of individual's past and current experiences of substance use and withdrawal: regular etoh and marijuana use  Dimension 2:  Biomedical Conditions and Complications:      Dimension 3:  Emotional, Behavioral, or Cognitive Conditions and Complications:     Dimension 4:  Readiness to Change:     Dimension 5:  Relapse, Continued use, or Continued Problem Potential:      Dimension 6:  Recovery/Living Environment:     ASAM Severity Score: ASAM's Severity Rating Score: 5  ASAM Recommended Level of Treatment:     Substance use Disorder (SUD)    Recommendations for Services/Supports/Treatments: Recommendations for Services/Supports/Treatments Recommendations For Services/Supports/Treatments: Partial Hospitalization  DSM5 Diagnoses: Patient Active Problem List   Diagnosis Date Noted  . MDD (major depressive disorder), recurrent severe, without psychosis (HCC) 05/24/2020  . Depression, major, recurrent, severe with psychosis (HCC)   . Suicidal ideation   . Perianal abscess 03/20/2017    Patient Centered Plan: Patient is on the following Treatment Plan(s):  Depression   Referrals to Alternative Service(s): Referred  to Alternative Service(s):   Place:   Date:   Time:    Referred to Alternative Service(s):   Place:   Date:   Time:    Referred to Alternative Service(s):   Place:   Date:   Time:    Referred to Alternative Service(s):   Place:   Date:   Time:     [name], [facility], LCASA

## 2020-05-31 ENCOUNTER — Telehealth (HOSPITAL_COMMUNITY): Payer: Self-pay | Admitting: Professional

## 2020-05-31 ENCOUNTER — Ambulatory Visit (HOSPITAL_COMMUNITY): Payer: Self-pay

## 2020-06-01 ENCOUNTER — Ambulatory Visit (HOSPITAL_COMMUNITY): Payer: No Payment, Other

## 2020-06-01 ENCOUNTER — Other Ambulatory Visit: Payer: Self-pay

## 2020-06-05 ENCOUNTER — Telehealth (HOSPITAL_COMMUNITY): Payer: Self-pay | Admitting: Professional

## 2020-06-07 ENCOUNTER — Other Ambulatory Visit: Payer: Self-pay

## 2020-06-07 ENCOUNTER — Ambulatory Visit (HOSPITAL_COMMUNITY): Payer: No Payment, Other

## 2020-06-07 NOTE — Progress Notes (Incomplete)
Virtual Visit via Video Note  I connected with Ronnie Hoover on 06/07/20 at  9:00 AM EST by a video enabled telemedicine application and verified that I am speaking with the correct person using two identifiers.  Location: Patient: *** Provider: ***   I discussed the limitations of evaluation and management by telemedicine and the availability of in person appointments. The patient expressed understanding and agreed to proceed.  History of Present Illness:    Observations/Objective:   Assessment and Plan:   Follow Up Instructions:    I discussed the assessment and treatment plan with the patient. The patient was provided an opportunity to ask questions and all were answered. The patient agreed with the plan and demonstrated an understanding of the instructions.   The patient was advised to call back or seek an in-person evaluation if the symptoms worsen or if the condition fails to improve as anticipated.  I provided *** minutes of non-face-to-face time during this encounter.   Oneta Rack, NP    Psychiatric Initial Adult Assessment   Patient Identification: Ronnie Hoover MRN:  [id number] Date of Evaluation:  06/07/2020 Referral Source: *** Chief Complaint:   Visit Diagnosis: No diagnosis found.  History of Present Illness:  ***  Associated Signs/Symptoms: Depression Symptoms:  {DEPRESSION SYMPTOMS:20000} (Hypo) Manic Symptoms:  {BHH MANIC SYMPTOMS:22872} Anxiety Symptoms:  {BHH ANXIETY SYMPTOMS:22873} Psychotic Symptoms:  {BHH PSYCHOTIC SYMPTOMS:22874} PTSD Symptoms: {BHH PTSD SYMPTOMS:22875}  Past Psychiatric History: ***  Previous Psychotropic Medications: {YES/NO:21197}  Substance Abuse History in the last 12 months:  {yes no:314532}  Consequences of Substance Abuse: {BHH CONSEQUENCES OF SUBSTANCE ABUSE:22880}  Past Medical History:  Past Medical History:  Diagnosis Date  . Abscess   . Bronchitis     Past Surgical History:  Procedure  Laterality Date  . IRRIGATION AND DEBRIDEMENT ABSCESS Right 03/20/2017   Procedure: IRRIGATION AND DEBRIDEMENT ABSCESS , EUA, DRAIN PLACEMENT;  Surgeon: Andria Meuse, MD;  Location: [facility] OR;  Service: General;  Laterality: Right;    Family Psychiatric History: ***  Family History:  Family History  Family history unknown: Yes    Social History:   Social History   Socioeconomic History  . Marital status: Single    Spouse name: Not on file  . Number of children: Not on file  . Years of education: Not on file  . Highest education level: Not on file  Occupational History  . Not on file  Tobacco Use  . Smoking status: Current Every Day Smoker    Packs/day: 1.00    Types: Cigarettes  . Smokeless tobacco: Never Used  Vaping Use  . Vaping Use: Never used  Substance and Sexual Activity  . Alcohol use: Yes    Comment: occasionally  . Drug use: Yes    Types: Marijuana  . Sexual activity: Not on file  Other Topics Concern  . Not on file  Social History Narrative  . Not on file   Social Determinants of Health   Financial Resource Strain: Not on file  Food Insecurity: Not on file  Transportation Needs: Not on file  Physical Activity: Not on file  Stress: Not on file  Social Connections: Not on file    Additional Social History: ***  Allergies:   Allergies  Allergen Reactions  . Banana Anaphylaxis and Nausea And Vomiting  . Tomato Anaphylaxis and Hives    Metabolic Disorder Labs: Lab Results  Component Value Date   HGBA1C 5.8 04/08/2017   No results found for:  PROLACTIN No results found for: CHOL, TRIG, HDL, CHOLHDL, VLDL, LDLCALC No results found for: TSH  Therapeutic Level Labs: No results found for: LITHIUM No results found for: CBMZ No results found for: VALPROATE  Current Medications: Current Outpatient Medications  Medication Sig Dispense Refill  . amoxicillin (AMOXIL) 500 MG capsule Take 2 capsules (1,000 mg total) by mouth 2 (two) times  daily. (Patient not taking: Reported on 05/23/2020) 40 capsule 0  . cyclobenzaprine (FLEXERIL) 5 MG tablet Take 1-2 tablets (5-10 mg total) by mouth 2 (two) times daily as needed for muscle spasms. 24 tablet 0  . predniSONE (STERAPRED UNI-PAK 21 TAB) 10 MG (21) TBPK tablet 6 tabs for 1 day, then 5 tabs for 1 das, then 4 tabs for 1 day, then 3 tabs for 1 day, 2 tabs for 1 day, then 1 tab for 1 day (Patient not taking: Reported on 05/23/2020) 21 tablet 0  . sertraline (ZOLOFT) 25 MG tablet Take 2 tablets (50 mg total) by mouth daily. 30 tablet 0  . traMADol (ULTRAM) 50 MG tablet Take 1 tablet (50 mg total) by mouth every 12 (twelve) hours as needed. (Patient taking differently: Take 50 mg by mouth every 12 (twelve) hours as needed for moderate pain.) 12 tablet 0   No current facility-administered medications for this visit.    Musculoskeletal: Strength & Muscle Tone: {desc; muscle tone:32375} Gait & Station: {PE GAIT ED EUMP:53614} Patient leans: {Patient Leans:21022755}  Psychiatric Specialty Exam: Review of Systems  There were no vitals taken for this visit.There is no height or weight on file to calculate BMI.  General Appearance: {Appearance:22683}  Eye Contact:  {BHH EYE CONTACT:22684}  Speech:  {Speech:22685}  Volume:  {Volume (PAA):22686}  Mood:  {BHH MOOD:22306}  Affect:  {Affect (PAA):22687}  Thought Process:  {Thought Process (PAA):22688}  Orientation:  {BHH ORIENTATION (PAA):22689}  Thought Content:  {Thought Content:22690}  Suicidal Thoughts:  {ST/HT (PAA):22692}  Homicidal Thoughts:  {ST/HT (PAA):22692}  Memory:  {BHH MEMORY:22881}  Judgement:  {Judgement (PAA):22694}  Insight:  {Insight (PAA):22695}  Psychomotor Activity:  {Psychomotor (PAA):22696}  Concentration:  {Concentration:21399}  Recall:  {BHH GOOD/FAIR/POOR:22877}  Fund of Knowledge:{BHH GOOD/FAIR/POOR:22877}  Language: {BHH GOOD/FAIR/POOR:22877}  Akathisia:  {BHH YES OR NO:22294}  Handed:  {Handed:22697}   AIMS (if indicated):  {Desc; done/not:10129}  Assets:  {Assets (PAA):22698}  ADL's:  {BHH ERX'V:40086}  Cognition: {chl bhh cognition:304700322}  Sleep:  {BHH GOOD/FAIR/POOR:22877}   Screenings: GAD-7   Flowsheet Row ED from 05/23/2020 in [facility] Office Visit from 04/08/2017 [facility]  Total GAD-7 Score 21 0    PHQ2-9   Flowsheet Row ED from 05/23/2020 in [facility] Office Visit from 04/08/2017 in [facility]  PHQ-2 Total Score 6 0  PHQ-9 Total Score 24 2      Assessment and Plan: ***   Oneta Rack, NP 1/12/20228:58 AM

## 2020-06-08 ENCOUNTER — Ambulatory Visit (HOSPITAL_COMMUNITY): Payer: No Payment, Other

## 2020-06-09 ENCOUNTER — Ambulatory Visit (HOSPITAL_COMMUNITY): Payer: Self-pay

## 2020-06-09 ENCOUNTER — Other Ambulatory Visit: Payer: Medicaid Other

## 2020-06-09 DIAGNOSIS — Z20822 Contact with and (suspected) exposure to covid-19: Secondary | ICD-10-CM

## 2020-06-11 LAB — SARS-COV-2, NAA 2 DAY TAT

## 2020-06-11 LAB — NOVEL CORONAVIRUS, NAA: SARS-CoV-2, NAA: NOT DETECTED

## 2020-06-12 ENCOUNTER — Ambulatory Visit (HOSPITAL_COMMUNITY): Payer: Self-pay

## 2020-06-13 ENCOUNTER — Ambulatory Visit (HOSPITAL_COMMUNITY): Payer: Self-pay

## 2020-06-14 ENCOUNTER — Ambulatory Visit (HOSPITAL_COMMUNITY): Payer: Self-pay

## 2020-06-15 ENCOUNTER — Ambulatory Visit (HOSPITAL_COMMUNITY): Payer: Self-pay

## 2020-06-16 ENCOUNTER — Ambulatory Visit (HOSPITAL_COMMUNITY): Payer: Self-pay

## 2021-01-14 ENCOUNTER — Emergency Department (HOSPITAL_COMMUNITY): Payer: Medicaid Other

## 2021-01-14 ENCOUNTER — Emergency Department (HOSPITAL_COMMUNITY)
Admission: EM | Admit: 2021-01-14 | Discharge: 2021-01-15 | Disposition: A | Discharge status: Home / Self Care | Payer: Medicaid Other | Attending: Emergency Medicine | Admitting: Emergency Medicine

## 2021-01-14 ENCOUNTER — Encounter (HOSPITAL_COMMUNITY): Payer: Self-pay

## 2021-01-14 ENCOUNTER — Other Ambulatory Visit: Payer: Self-pay

## 2021-01-14 DIAGNOSIS — X58XXXA Exposure to other specified factors, initial encounter: Secondary | ICD-10-CM | POA: Insufficient documentation

## 2021-01-14 DIAGNOSIS — R0789 Other chest pain: Secondary | ICD-10-CM

## 2021-01-14 DIAGNOSIS — Y900 Blood alcohol level of less than 20 mg/100 ml: Secondary | ICD-10-CM | POA: Insufficient documentation

## 2021-01-14 DIAGNOSIS — Z20822 Contact with and (suspected) exposure to covid-19: Secondary | ICD-10-CM | POA: Insufficient documentation

## 2021-01-14 DIAGNOSIS — F17210 Nicotine dependence, cigarettes, uncomplicated: Secondary | ICD-10-CM | POA: Insufficient documentation

## 2021-01-14 DIAGNOSIS — T50902A Poisoning by unspecified drugs, medicaments and biological substances, intentional self-harm, initial encounter: Secondary | ICD-10-CM | POA: Insufficient documentation

## 2021-01-14 DIAGNOSIS — Z79899 Other long term (current) drug therapy: Secondary | ICD-10-CM | POA: Insufficient documentation

## 2021-01-14 HISTORY — DX: Lumbago with sciatica, right side: M54.41

## 2021-01-14 HISTORY — DX: Lumbago with sciatica, right side: M54.42

## 2021-01-14 LAB — COMPREHENSIVE METABOLIC PANEL
ALT: 19 U/L (ref 0–44)
AST: 37 U/L (ref 15–41)
Albumin: 4.5 g/dL (ref 3.5–5.0)
Alkaline Phosphatase: 58 U/L (ref 38–126)
Anion gap: 8 (ref 5–15)
BUN: 7 mg/dL (ref 6–20)
CO2: 24 mmol/L (ref 22–32)
Calcium: 9.8 mg/dL (ref 8.9–10.3)
Chloride: 103 mmol/L (ref 98–111)
Creatinine, Ser: 1.14 mg/dL (ref 0.61–1.24)
GFR, Estimated: >60 mL/min (ref >60)
Glucose, Bld: 109 mg/dL — ABNORMAL HIGH (ref 70–99)
Potassium: 4.7 mmol/L (ref 3.5–5.1)
Sodium: 135 mmol/L (ref 135–145)
Total Bilirubin: 2.3 mg/dL — ABNORMAL HIGH (ref 0.3–1.2)
Total Protein: 7.7 g/dL (ref 6.5–8.1)

## 2021-01-14 LAB — URINALYSIS, ROUTINE W REFLEX MICROSCOPIC
Bilirubin Urine: NEGATIVE
Glucose, UA: NEGATIVE mg/dL
Hgb urine dipstick: NEGATIVE
Ketones, ur: NEGATIVE mg/dL
Leukocytes,Ua: NEGATIVE
Nitrite: NEGATIVE
Protein, ur: NEGATIVE mg/dL
Specific Gravity, Urine: 1.009 (ref 1.005–1.030)
pH: 6 (ref 5.0–8.0)

## 2021-01-14 LAB — SALICYLATE LEVEL: Salicylate Lvl: <7 mg/dL — ABNORMAL LOW (ref 7.0–30.0)

## 2021-01-14 LAB — CBC
HCT: 48.8 % (ref 39.0–52.0)
Hemoglobin: 16.9 g/dL (ref 13.0–17.0)
MCH: 34.1 pg — ABNORMAL HIGH (ref 26.0–34.0)
MCHC: 34.6 g/dL (ref 30.0–36.0)
MCV: 98.6 fL (ref 80.0–100.0)
Platelets: 269 10*3/uL (ref 150–400)
RBC: 4.95 MIL/uL (ref 4.22–5.81)
RDW: 13.2 % (ref 11.5–15.5)
WBC: 7.5 10*3/uL (ref 4.0–10.5)
nRBC: 0 % (ref 0.0–0.2)

## 2021-01-14 LAB — RESP PANEL BY RT-PCR (FLU A&B, COVID) ARPGX2
Influenza A by PCR: NEGATIVE
Influenza B by PCR: NEGATIVE
SARS Coronavirus 2 by RT PCR: NEGATIVE

## 2021-01-14 LAB — RAPID URINE DRUG SCREEN, HOSP PERFORMED
Amphetamines: POSITIVE — AB
Barbiturates: NOT DETECTED
Benzodiazepines: NOT DETECTED
Cocaine: NOT DETECTED
Opiates: NOT DETECTED
Tetrahydrocannabinol: POSITIVE — AB

## 2021-01-14 LAB — ETHANOL: Alcohol, Ethyl (B): <10 mg/dL (ref <10)

## 2021-01-14 LAB — ACETAMINOPHEN LEVEL: Acetaminophen (Tylenol), Serum: <10 ug/mL — ABNORMAL LOW (ref 10–30)

## 2021-01-14 LAB — CBG MONITORING, ED: Glucose-Capillary: 107 mg/dL — ABNORMAL HIGH (ref 70–99)

## 2021-01-14 MED ORDER — ALUM & MAG HYDROXIDE-SIMETH 200-200-20 MG/5ML PO SUSP
30.0000 mL | Freq: Once | ORAL | Status: AC
  Administered 2021-01-14: 30 mL via ORAL
  Filled 2021-01-14: qty 30

## 2021-01-14 MED ORDER — IBUPROFEN 400 MG PO TABS: 600.0000 mg | ORAL_TABLET | Freq: Three times a day (TID) | ORAL | Status: DC | PRN

## 2021-01-14 MED ORDER — KETOROLAC TROMETHAMINE 30 MG/ML IJ SOLN
30.0000 mg | Freq: Once | INTRAMUSCULAR | Status: AC
  Administered 2021-01-14: 30 mg via INTRAVENOUS
  Filled 2021-01-14: qty 1

## 2021-01-14 MED ORDER — SODIUM CHLORIDE 0.9 % IV BOLUS
1000.0000 mL | Freq: Once | INTRAVENOUS | Status: AC
  Administered 2021-01-14: 1000 mL via INTRAVENOUS

## 2021-01-14 MED ORDER — ONDANSETRON HCL 4 MG PO TABS: 4.0000 mg | ORAL_TABLET | Freq: Three times a day (TID) | ORAL | Status: DC | PRN

## 2021-01-14 MED ORDER — ALUM & MAG HYDROXIDE-SIMETH 200-200-20 MG/5ML PO SUSP: 30.0000 mL | Freq: Four times a day (QID) | ORAL | Status: DC | PRN

## 2021-01-14 NOTE — ED Notes (Signed)
Pt tearful, reports he and his girlfriend of 8 years broke up yesterday. Pt reports a hx of suicidal thoughts but until today has never followed through with his thoughts. Pt reports seeking inpatient help in the past when he had these thoughts

## 2021-01-14 NOTE — ED Notes (Signed)
Pt given sandwich and snacks.

## 2021-01-14 NOTE — ED Triage Notes (Signed)
Pt BIB [facility] from home for a possible overdose. Pt reports he took 2.5 muscle relaxer's. Pt endorses SI due to breaking up with his girlfriend yesterday. Pt reports to EMS, "I don't want to be here anymore"   Unsure of muscle relaxer, usually take 1 tab a day for sciatic back pain.  18g LAC  BP 134/88 HR 86 RR 22 98% RA CBG 117

## 2021-01-14 NOTE — BH Assessment (Signed)
Comprehensive Clinical Assessment (CCA) Note  01/14/2021 Ronnie Hoover [id number]  Disposition: Hillery Jacks, NP, recommend inpt tx  Chief Complaint: 35 year old male present to [facility] after a suicidal intent via overdose of muscle relaxers.  Patient reported he attempted to overdose on muscle relaxers around 11am this morning 01/14/2021. Report was found by sister. Trigger for suicidal intent depression and recent breakup with girlfriend. Report him and his girlfriend officially breakup November 2021 then attempted to makeup and rebuild the relationship. Report his girlfriend put him out two Mondays ago. She will not allow him to see the kids. Report he thought why live and attempted to take his life via overdose. Denied homicidal ideations and denied auditory/visual hallucinations.     Chief Complaint  Patient presents with   Drug Overdose   Visit Diagnosis: Depression    CCA Screening, Triage and Referral (STR)  Patient Reported Information How did you hear about [place]? Self  What Is the Reason for Your Visit/Call Today? Patient reported he attempted to overdose on musle relaxers around 11am this morning 01/14/2021. Report was found by sister. Trigger for suicidal intent depression and recent breakup with girlfriend. Report him and his girlfriend officially breakup November 2021 then attempted to makeup and rebuild the relationship. Report his girlfriend put him out two Mondays ago. She will not allow him to see the kids. Report he thought why live and attempted to take his life via overdose. Denied homicidal ideations and denied auditory/visual hallucinations.  How Long Has This Been Causing You Problems? 1 wk - 1 month (Depression and recent breakup with girlfriend)  What Do You Feel Would Help You the Most Today? Stress Management   Have You Recently Had Any Thoughts About Hurting Yourself? Yes  Are You Planning to Commit Suicide/Harm Yourself At This time? Yes   Have you  Recently Had Thoughts About Hurting Someone Karolee Ohs? No  Are You Planning to Harm Someone at This Time? No  Explanation: No data recorded  Have You Used Any Alcohol or Drugs in the Past 24 Hours? Yes  How Long Ago Did You Use Drugs or Alcohol? No data recorded What Did You Use and How Much? smoke marijuana last night and drink vodka/beer last night   Do You Currently Have a Therapist/Psychiatrist? No  Name of Therapist/Psychiatrist: No data recorded  Have You Been Recently Discharged From Any Office Practice or Programs? No  Explanation of Discharge From Practice/Program: No data recorded    CCA Screening Triage Referral Assessment Type of Contact: Tele-Assessment  Telemedicine Service Delivery:   Is this Initial or Reassessment? Initial Assessment  Date Telepsych consult ordered in [facility]:  01/14/21  Time Telepsych consult ordered in [facility]:  1523  Location of Assessment: [facility] Assessment Services  Provider Location: No data recorded  Collateral Involvement: none   Does Patient Have a Court Appointed Legal Guardian? No data recorded Name and Contact of Legal Guardian: No data recorded If Minor and Not Living with Parent(s), Who has Custody? No data recorded Is CPS involved or ever been involved? Never  Is APS involved or ever been involved? Never   Patient Determined To Be At Risk for Harm To Self or Others Based on Review of Patient Reported Information or Presenting Complaint? No  Method: No data recorded Availability of Means: No data recorded Intent: No data recorded Notification Required: No data recorded Additional Information for Danger to Others Potential: No data recorded Additional Comments for Danger to Others Potential: No data recorded Are  There Guns or Other Weapons in Your Home? No data recorded Types of Guns/Weapons: No data recorded Are These Weapons Safely Secured?                            No data recorded Who Could Verify You Are Able To Have  These Secured: No data recorded Do You Have any Outstanding Charges, Pending Court Dates, Parole/Probation? No data recorded Contacted To Inform of Risk of Harm To Self or Others: No data recorded   Does Patient Present under Involuntary Commitment? No  IVC Papers Initial File Date: No data recorded  [place] of Residence: [place]   Patient Currently Receiving the Following Services: Not Receiving Services   Determination of Need: Emergent (2 hours)   Options For Referral: Partial Hospitalization; Outpatient Therapy; Medication Management     CCA Biopsychosocial Patient Reported Schizophrenia/Schizoaffective Diagnosis in Past: No   Strengths: family support; friend support   Mental Health Symptoms Depression:   Change in energy/activity; Difficulty Concentrating; Tearfulness; Sleep (too much or little); Weight gain/loss; Fatigue; Worthlessness; Hopelessness; Increase/decrease in appetite; Irritability   Duration of Depressive symptoms:  Duration of Depressive Symptoms: Greater than two weeks   Mania:   Racing thoughts   Anxiety:    Worrying; Tension; Sleep; Restlessness; Irritability; Fatigue; Difficulty concentrating   Psychosis:   Hallucinations (pt hearing voices telling him he's not worthy of living)   Duration of Psychotic symptoms:    Trauma:   None   Obsessions:   None   Compulsions:   None   Inattention:   None   Hyperactivity/Impulsivity:   N/A   Oppositional/Defiant Behaviors:   None   Emotional Irregularity:   Mood lability; Chronic feelings of emptiness; Unstable self-image   Other Mood/Personality Symptoms:  No data recorded   Mental Status Exam Appearance and self-care  Stature:   Tall   Weight:   Average weight   Clothing:   Casual   Grooming:   Normal   Cosmetic use:   None   Posture/gait:   Normal   Motor activity:   Not Remarkable   Sensorium  Attention:   Normal   Concentration:   Normal    Orientation:   X5   Recall/memory:   Normal   Affect and Mood  Affect:   Anxious; Depressed   Mood:   Depressed; Anxious   Relating  Eye contact:   Normal   Facial expression:   Anxious; Depressed   Attitude toward examiner:   Cooperative   Thought and Language  Speech flow:  Clear and Coherent   Thought content:   Appropriate to Mood and Circumstances   Preoccupation:   Suicide   Hallucinations:   Auditory   Organization:  No data recorded  [employer] of Knowledge:   Fair   Intelligence:   Average   Abstraction:   Normal   Judgement:   Fair   Dance movement psychotherapist:   Variable   Insight:   Poor   Decision Making:   Impulsive   Social Functioning  Social Maturity:   Impulsive   Social Judgement:   Heedless   Stress  Stressors:   Relationship; Grief/losses; Housing; Surveyor, quantity; Transitions; Work   Coping Ability:   Contractor Deficits:   None   Supports:   Family; Friends/Service system     Religion: Religion/Spirituality Are You A Religious Person?: Yes What is Your Religious Affiliation?: Environmental consultant: Leisure /  Recreation Do You Have Hobbies?: Yes Leisure and Hobbies: writing and making music  Exercise/Diet: Exercise/Diet Do You Exercise?: No Have You Gained or Lost A Significant Amount of Weight in the Past Six Months?: No Do You Follow a Special Diet?: No Do You Have Any Trouble Sleeping?: Yes Explanation of Sleeping Difficulties: pt report he does not sleep, report getting about an hour of sleep per night   CCA Employment/Education Employment/Work Situation: Employment / Work Situation Employment Situation: Employed Patient's Job has Been Impacted by Current Illness: Yes Has Patient ever Been in Equities trader?: No  Education: Education Last Grade Completed: 12 Did You Product manager?: Yes Did You Have An Individualized Education Program (IIEP): No Did You Have Any  Difficulty At [employer]?: No   CCA Family/Childhood History Family and Relationship History: Family history Does patient have children?: Yes  Childhood History:  Childhood History By whom was/is the patient raised?: Mother/father and step-parent Did patient suffer any verbal/emotional/physical/sexual abuse as a child?: No Has patient ever been sexually abused/assaulted/raped as an adolescent or adult?: No Witnessed domestic violence?: No Has patient been affected by domestic violence as an adult?: No  Child/Adolescent Assessment:     CCA Substance Use Alcohol/Drug Use: Alcohol / Drug Use Pain Medications: see MAR Prescriptions: see MAR Over the Counter: see MAR History of alcohol / drug use?: Yes Substance #1 Name of Substance 1: THC 1 - Age of First Use: 17 1 - Amount (size/oz): varies 1 - Frequency: daily 1 - Last Use / Amount: 01/13/2021 1- Route of Use: smoking Substance #2 Name of Substance 2: Alcohol 2 - Age of First Use: 22 2 - Amount (size/oz): beers (dail) drink 12 pack within two days 2 - Frequency: daily 2 - Duration: years 2 - Last Use / Amount: 10/13/2020 2 - Route of Substance Use: alcohol                     ASAM's:  Six Dimensions of Multidimensional Assessment  Dimension 1:  Acute Intoxication and/or Withdrawal Potential:   Dimension 1:  Description of individual's past and current experiences of substance use and withdrawal: regular etoh and marijuana use  Dimension 2:  Biomedical Conditions and Complications:      Dimension 3:  Emotional, Behavioral, or Cognitive Conditions and Complications:     Dimension 4:  Readiness to Change:     Dimension 5:  Relapse, Continued use, or Continued Problem Potential:     Dimension 6:  Recovery/Living Environment:     ASAM Severity Score: ASAM's Severity Rating Score: 5  ASAM Recommended Level of Treatment:     Substance use Disorder (SUD)    Recommendations for  Services/Supports/Treatments: Recommendations for Services/Supports/Treatments Recommendations For Services/Supports/Treatments: Partial Hospitalization  Discharge Disposition:    DSM5 Diagnoses: Patient Active Problem List   Diagnosis Date Noted   MDD (major depressive disorder), recurrent severe, without psychosis (HCC) 05/24/2020   Depression, major, recurrent, severe with psychosis (HCC)    Suicidal ideation    Perianal abscess 03/20/2017     Referrals to Alternative Service(s): Referred to Alternative Service(s):   Place:   Date:   Time:    Referred to Alternative Service(s):   Place:   Date:   Time:    Referred to Alternative Service(s):   Place:   Date:   Time:    Referred to Alternative Service(s):   Place:   Date:   Time:     Emara Lichter, LCAS

## 2021-01-14 NOTE — ED Notes (Signed)
TTS in process 

## 2021-01-14 NOTE — ED Provider Notes (Signed)
[facility] EMERGENCY DEPARTMENT Provider Note   CSN: [id number] Arrival date & time: 01/14/21  1351     History Chief Complaint  Patient presents with   Drug Overdose    Ronnie Hoover is a 35 y.o. male.  Pt presents to the ED today with a drug overdose.  Pt takes a muscle relaxer for back pain.  He took 2.5 of them instead of 1 to try to kill himself.  Pt said he broke up with his GF yesterday and does not want to be here any more.  He denies any other drugs ingested.  He does have some CP which has been there since he took the pills.  He was on Zoloft for depression, but has been out for several months.       Past Medical History:  Diagnosis Date   Abscess    Bronchitis    Low back pain with bilateral sciatica, unspecified back pain laterality, unspecified chronicity     Patient Active Problem List   Diagnosis Date Noted   MDD (major depressive disorder), recurrent severe, without psychosis (HCC) 05/24/2020   Depression, major, recurrent, severe with psychosis (HCC)    Suicidal ideation    Perianal abscess 03/20/2017    Past Surgical History:  Procedure Laterality Date   IRRIGATION AND DEBRIDEMENT ABSCESS Right 03/20/2017   Procedure: IRRIGATION AND DEBRIDEMENT ABSCESS , EUA, DRAIN PLACEMENT;  Surgeon: Andria Meuse, MD;  Location: [facility] OR;  Service: General;  Laterality: Right;       Family History  Family history unknown: Yes    Social History   Tobacco Use   Smoking status: Every Day    Packs/day: 1.00    Types: Cigarettes   Smokeless tobacco: Never  Vaping Use   Vaping Use: Never used  Substance Use Topics   Alcohol use: Yes    Comment: occasionally   Drug use: Yes    Types: Marijuana    Home Medications Prior to Admission medications   Medication Sig Start Date End Date Taking? Authorizing Provider  amoxicillin (AMOXIL) 500 MG capsule Take 2 capsules (1,000 mg total) by mouth 2 (two) times daily. Patient not taking:  Reported on 05/23/2020 02/08/20   Dahlia Byes A, NP  cyclobenzaprine (FLEXERIL) 5 MG tablet Take 1-2 tablets (5-10 mg total) by mouth 2 (two) times daily as needed for muscle spasms. 02/07/20   Dahlia Byes A, NP  predniSONE (STERAPRED UNI-PAK 21 TAB) 10 MG (21) TBPK tablet 6 tabs for 1 day, then 5 tabs for 1 das, then 4 tabs for 1 day, then 3 tabs for 1 day, 2 tabs for 1 day, then 1 tab for 1 day Patient not taking: Reported on 05/23/2020 02/07/20   Dahlia Byes A, NP  sertraline (ZOLOFT) 25 MG tablet Take 2 tablets (50 mg total) by mouth daily. 05/24/20   Mancel Bale, MD  traMADol (ULTRAM) 50 MG tablet Take 1 tablet (50 mg total) by mouth every 12 (twelve) hours as needed. Patient taking differently: Take 50 mg by mouth every 12 (twelve) hours as needed for moderate pain. 02/07/20   Janace Aris, NP    Allergies    Banana and Tomato  Review of Systems   Review of Systems  Cardiovascular:  Positive for chest pain.  Psychiatric/Behavioral:  Positive for suicidal ideas.   All other systems reviewed and are negative.  Physical Exam Updated Vital Signs BP (!) 138/100 (BP Location: Right Arm)   Pulse 74  Temp 98.5 F (36.9 C) (Oral)   Resp 16   Ht 6\' 1"  (1.854 m)   Wt 63.5 kg   SpO2 100%   BMI 18.47 kg/m   Physical Exam Vitals and nursing note reviewed.  Constitutional:      Appearance: Normal appearance.  HENT:     Head: Normocephalic and atraumatic.     Right Ear: External ear normal.     Left Ear: External ear normal.     Nose: Nose normal.     Mouth/Throat:     Mouth: Mucous membranes are moist.     Pharynx: Oropharynx is clear.  Eyes:     Extraocular Movements: Extraocular movements intact.     Conjunctiva/sclera: Conjunctivae normal.     Pupils: Pupils are equal, round, and reactive to light.  Cardiovascular:     Rate and Rhythm: Normal rate and regular rhythm.     Pulses: Normal pulses.     Heart sounds: Normal heart sounds.  Pulmonary:     Effort: Pulmonary  effort is normal.     Breath sounds: Normal breath sounds.  Chest:    Abdominal:     General: Abdomen is flat. Bowel sounds are normal.     Palpations: Abdomen is soft.  Musculoskeletal:        General: Normal range of motion.     Cervical back: Normal range of motion and neck supple.  Skin:    General: Skin is warm.     Capillary Refill: Capillary refill takes less than 2 seconds.  Neurological:     General: No focal deficit present.     Mental Status: He is alert and oriented to person, place, and time.  Psychiatric:        Mood and Affect: Mood normal.        Behavior: Behavior normal.    ED Results / Procedures / Treatments   Labs (all labs ordered are listed, but only abnormal results are displayed) Labs Reviewed  COMPREHENSIVE METABOLIC PANEL - Abnormal; Notable for the following components:      Result Value   Glucose, Bld 109 (*)    Total Bilirubin 2.3 (*)    All other components within normal limits  SALICYLATE LEVEL - Abnormal; Notable for the following components:   Salicylate Lvl <7.0 (*)    All other components within normal limits  ACETAMINOPHEN LEVEL - Abnormal; Notable for the following components:   Acetaminophen (Tylenol), Serum <10 (*)    All other components within normal limits  CBC - Abnormal; Notable for the following components:   MCH 34.1 (*)    All other components within normal limits  CBG MONITORING, ED - Abnormal; Notable for the following components:   Glucose-Capillary 107 (*)    All other components within normal limits  RESP PANEL BY RT-PCR (FLU A&B, COVID) ARPGX2  ETHANOL  URINALYSIS, ROUTINE W REFLEX MICROSCOPIC  RAPID URINE DRUG SCREEN, HOSP PERFORMED    EKG None  Radiology DG Chest 2 View  Result Date: 01/14/2021 CLINICAL DATA:  Pt reports he took 2.5 muscle relaxer's. Pt endorses SI due to breaking up with his girlfriend yesterday. EXAM: CHEST - 2 VIEW COMPARISON:  12/27/2015 FINDINGS: Cardiac silhouette is normal in size  and configuration. Normal mediastinal and hilar contours. Lungs are hyperexpanded, but clear. No pleural effusion or pneumothorax. Skeletal structures are unremarkable. IMPRESSION: No active cardiopulmonary disease. Electronically Signed   By: 02/26/2016 M.D.   On: 01/14/2021 15:14    Procedures Procedures  Medications Ordered in ED Medications  ketorolac (TORADOL) 30 MG/ML injection 30 mg (has no administration in time range)  sodium chloride 0.9 % bolus 1,000 mL (has no administration in time range)  alum & mag hydroxide-simeth (MAALOX/MYLANTA) 200-200-20 MG/5ML suspension 30 mL (has no administration in time range)  ibuprofen (ADVIL) tablet 600 mg (has no administration in time range)  ondansetron (ZOFRAN) tablet 4 mg (has no administration in time range)  alum & mag hydroxide-simeth (MAALOX/MYLANTA) 200-200-20 MG/5ML suspension 30 mL (has no administration in time range)    ED Course  I have reviewed the triage vital signs and the nursing notes.  Pertinent labs & imaging results that were available during my care of the patient were reviewed by me and considered in my medical decision making (see chart for details).    MDM Rules/Calculators/A&P                           CP is very atypical.  Overdose is minimal.   He is medically clear for TTS eval.  Final Clinical Impression(s) / ED Diagnoses Final diagnoses:  Intentional drug overdose, initial encounter [facility])  Atypical chest pain    Rx / DC Orders ED Discharge Orders     None        Jacalyn Lefevre, MD 01/14/21 1524

## 2021-01-15 ENCOUNTER — Inpatient Hospital Stay (HOSPITAL_COMMUNITY)
Admission: AD | Admit: 2021-01-15 | Discharge: 2021-01-18 | DRG: 885 | Disposition: A | Discharge status: Home / Self Care | Payer: Federal, State, Local not specified - Other | Source: Intra-hospital | Attending: Emergency Medicine | Admitting: Emergency Medicine

## 2021-01-15 ENCOUNTER — Other Ambulatory Visit: Payer: Self-pay | Admitting: Registered Nurse

## 2021-01-15 ENCOUNTER — Encounter (HOSPITAL_COMMUNITY): Payer: Self-pay | Admitting: Registered Nurse

## 2021-01-15 DIAGNOSIS — K5900 Constipation, unspecified: Secondary | ICD-10-CM | POA: Diagnosis present

## 2021-01-15 DIAGNOSIS — F1510 Other stimulant abuse, uncomplicated: Secondary | ICD-10-CM | POA: Diagnosis present

## 2021-01-15 DIAGNOSIS — R45851 Suicidal ideations: Secondary | ICD-10-CM

## 2021-01-15 DIAGNOSIS — F17210 Nicotine dependence, cigarettes, uncomplicated: Secondary | ICD-10-CM | POA: Diagnosis present

## 2021-01-15 DIAGNOSIS — Z720 Tobacco use: Secondary | ICD-10-CM | POA: Diagnosis present

## 2021-01-15 DIAGNOSIS — F12159 Cannabis abuse with psychotic disorder, unspecified: Secondary | ICD-10-CM | POA: Diagnosis present

## 2021-01-15 DIAGNOSIS — F419 Anxiety disorder, unspecified: Secondary | ICD-10-CM | POA: Diagnosis present

## 2021-01-15 DIAGNOSIS — F332 Major depressive disorder, recurrent severe without psychotic features: Secondary | ICD-10-CM | POA: Diagnosis not present

## 2021-01-15 DIAGNOSIS — Z9151 Personal history of suicidal behavior: Secondary | ICD-10-CM

## 2021-01-15 DIAGNOSIS — F1020 Alcohol dependence, uncomplicated: Secondary | ICD-10-CM | POA: Diagnosis present

## 2021-01-15 DIAGNOSIS — F12259 Cannabis dependence with psychotic disorder, unspecified: Secondary | ICD-10-CM | POA: Diagnosis not present

## 2021-01-15 LAB — TSH: TSH: 0.822 u[IU]/mL (ref 0.350–4.500)

## 2021-01-15 MED ORDER — ENSURE ENLIVE PO LIQD
237.0000 mL | Freq: Two times a day (BID) | ORAL | Status: DC
  Administered 2021-01-15 – 2021-01-18 (×3): 237 mL via ORAL
  Filled 2021-01-15 (×10): qty 237

## 2021-01-15 MED ORDER — HYDROXYZINE HCL 25 MG PO TABS
25.0000 mg | ORAL_TABLET | Freq: Three times a day (TID) | ORAL | Status: DC | PRN
  Administered 2021-01-15 – 2021-01-17 (×4): 25 mg via ORAL
  Filled 2021-01-15 (×4): qty 1

## 2021-01-15 MED ORDER — MAGNESIUM HYDROXIDE 400 MG/5ML PO SUSP: 30.0000 mL | Freq: Every day | ORAL | Status: DC | PRN

## 2021-01-15 MED ORDER — ALUM & MAG HYDROXIDE-SIMETH 200-200-20 MG/5ML PO SUSP: 30.0000 mL | ORAL | Status: DC | PRN

## 2021-01-15 MED ORDER — TRAZODONE HCL 50 MG PO TABS
50.0000 mg | ORAL_TABLET | Freq: Every evening | ORAL | Status: DC | PRN
  Administered 2021-01-15 – 2021-01-17 (×3): 50 mg via ORAL
  Filled 2021-01-15 (×3): qty 1

## 2021-01-15 MED ORDER — SERTRALINE HCL 50 MG PO TABS: 25.0000 mg | ORAL_TABLET | Freq: Every day | ORAL | Status: DC

## 2021-01-15 MED ORDER — SERTRALINE HCL 25 MG PO TABS
25.0000 mg | ORAL_TABLET | Freq: Every day | ORAL | Status: DC
  Administered 2021-01-15 – 2021-01-18 (×4): 25 mg via ORAL
  Filled 2021-01-15 (×7): qty 1

## 2021-01-15 MED ORDER — ACETAMINOPHEN 325 MG PO TABS
650.0000 mg | ORAL_TABLET | Freq: Four times a day (QID) | ORAL | Status: DC | PRN
  Administered 2021-01-15: 650 mg via ORAL

## 2021-01-15 MED ORDER — NICOTINE 14 MG/24HR TD PT24
14.0000 mg | MEDICATED_PATCH | Freq: Every day | TRANSDERMAL | Status: DC
  Administered 2021-01-15: 14 mg via TRANSDERMAL
  Filled 2021-01-15 (×7): qty 1

## 2021-01-15 NOTE — ED Notes (Signed)
Pt discharged to [facility]. Transported via safe transport

## 2021-01-15 NOTE — Tx Team (Signed)
Initial Treatment Plan 01/15/2021 4:32 PM Ronnie Hoover FMB:[id number]    PATIENT STRESSORS: Financial difficulties Marital or family conflict Medication change or noncompliance Occupational concerns Substance abuse   PATIENT STRENGTHS: Capable of independent living Psychologist, counselling means Religious Affiliation Supportive family/friends Work skills   PATIENT IDENTIFIED PROBLEMS: Alterations in mood (Anxiety & depression) "I'm feeling very sad since I can't talk to my kids anymore, I feel worthless".    Alterations in sleep pattern "I only sleep about 2-4 hours a night even when I drink. It's been like that for about 7 months now".    Risk for suicide "I overdosed on some muscle relaxers".    Substance Abuse "I drink 12 pkt beer every 2 days. I drink vodka occasionally, I smoke weed everyday".         DISCHARGE CRITERIA:  Improved stabilization in mood, thinking, and/or behavior Verbal commitment to aftercare and medication compliance Withdrawal symptoms are absent or subacute and managed without 24-hour nursing intervention  PRELIMINARY DISCHARGE PLAN: Outpatient therapy Return to previous living arrangement Return to previous work or school arrangements  PATIENT/FAMILY INVOLVEMENT: This treatment plan has been presented to and reviewed with the patient, Ronnie Hoover.  The patient have been given the opportunity to ask questions and make suggestions.  Sherryl Manges, RN 01/15/2021, 4:32 PM

## 2021-01-15 NOTE — [facility] Group Notes (Signed)
Pt attended AA meeting this evening.  

## 2021-01-15 NOTE — Progress Notes (Signed)
Pt is a 35 y/o [place] American male admitted to [facility] from [facility] where he presented initially after an intentional overdose on muscle relaxer. Pt is alert & oriented X3. Presents with fair eye contact, flat / worried affect, depressed mood, restless, tearful on interactions with logical speech. Endorsed passive SI without plan, verbally contracts for safety. Denies HI, VH and pain at this time. Pt also endorsed AH "The voices telling me that I'm worthless, I don't belong in this world". Per pt events leading to admission includes recent breakup from his girlfriend of 8 years "We broke up last November, then we started talking again, I moved back and now she does not even want to talk to me and is restricting me to talk to my kids. They are not mine biologically but I raised them and not being able to talk to them is very hard for me". Reports racing thoughts, poor sleep X 7 months "I only sleep 2-4 hours / night tops. I get about 3-4 when I do drink beer" and poor appetite. Pt states he drinks 12 pack beer Q 2 days, last used on Saturday, vodka occasionally last used on Friday and he smokes marijuana daily. Pt's UDS done on 01/14/21 was + for [facility] and Amphetamines with etoh <10. Pt rates his anxiety 9/10 and depression 10/10 with current stressor being "everything that's going on now in my life". Reports he works at [employer] 2 weeks now and he lives with his father. Skin assessment done, old scars noted to left arm without areas of breakdown to note. Belongings searched and grey shorts with strings secured in locker. Pt ambulatory in milieu with a steady gait.  Emotional support offered. Q 15 minutes safety checks initiated without self harm gestures or outburst. EKG done for complain of right sided chest pain and given to provider. Pt reports that he had no chest compression after overdose when he presented to the ED. Pt is asymptomatic without SOB, sweats. Reports that his pain is not radiating at  this time. Vitals done and fluids given due to pt being bradycardic. Pt tolerated lunch and fluids well. Tylenol and Vistaril given for pain 7/10 and anxiety 9/10. Will monitor and report changes in present status. Pt safe on unit at this time.

## 2021-01-15 NOTE — Progress Notes (Signed)
Patient information has been sent to [facility] via secure chat to review for potential admission. Patient has not yet been accepted. Patient meets inpatient criteria per Hillery Jacks, NP.   Situation ongoing, CSW will continue to monitor progress and update note as more information becomes available.    Signed:  Corky Crafts, MSW, [place], LCASA 01/15/2021 9:24 AM

## 2021-01-15 NOTE — Progress Notes (Signed)
Pt accepted to [facility] rm 406-2  Patient meets inpatient criteria per Hillery Jacks, NP.   Dr. Mason Jim is the attending provider.    Call report to [phone]    Victorino Dike, RN @ [facility] ED notified.     Pt scheduled  to arrive at [facility] at 1130 AM  Signed:  Corky Crafts, MSW, [place], LCASA 01/15/2021 9:42 AM

## 2021-01-15 NOTE — Plan of Care (Signed)
Notified by nursing that patient was complaining of some right-sided chest pain.  EKG was obtained which showed sinus bradycardia and possible repolarization.  On exam patient reports a sharp in nature pain centered around the third or fourth rib about 2 inches to the right of the sternum.  He reports that it comes and goes and has started since his overdose yesterday.  He reports no chest compressions done however he was found unresponsive and did fall.  He reports no nausea, vomiting, jaw pain, left arm pain, abdominal pain, crushing pressure, or sweating.  He reports that when he is having the pain it can be exacerbated by deeper breaths.  He reports no known family history of early heart disease.  Discussed with him that should symptoms change or worsen to notify staff immediately.  He reported understanding and that he would do this.   Arna Snipe MD Resident

## 2021-01-15 NOTE — Progress Notes (Addendum)
   01/15/21 2100  Psych Admission Type (Psych Patients Only)  Admission Status Voluntary  Psychosocial Assessment  Patient Complaints None  Eye Contact Brief  Facial Expression Other (Comment) (appropriate)  Affect Anxious  Speech Logical/coherent  Interaction Assertive  Motor Activity Other (Comment) (wnl)  Appearance/Hygiene In scrubs  Behavior Characteristics Cooperative;Anxious  Mood Pleasant  Thought Process  Coherency WDL  Content WDL  Delusions WDL  Perception WDL  Hallucination None reported or observed  Judgment Poor  Confusion None  Danger to Self  Current suicidal ideation? Denies  Danger to Others  Danger to Others None reported or observed   Pt seen at nurse's station after group. Pt denies SI, HI, AVH and pain. Pt is worried about missing work. Pt works at [employer] "Being with people keeps me focused. I feel better than being on my own. My co-workers are helpful. When I get out, my sister says she is gonna bring me to her house so I won't be by myself." Pt says he works most days of the week. Discussed LOS and speaking with provider about his concerns. Says his sister has spoken with his employer and this Clinical research associate informed him that he can get a note from social work at discharge. Pt says he feels better today after taking his medications. Given PRN sleep medications. "My daughter takes Trazodone and it makes her sleepy."

## 2021-01-15 NOTE — ED Notes (Signed)
Pt talking with family.

## 2021-01-15 NOTE — ED Notes (Signed)
Pt changed into purple scrubs. Belongings inventoried and placed in [employer] locker #8. Pt states that he does not have valuables in the department, including phone and wallet. He states to this RN that he sister has these valuables.

## 2021-01-16 DIAGNOSIS — M5430 Sciatica, unspecified side: Secondary | ICD-10-CM | POA: Insufficient documentation

## 2021-01-16 DIAGNOSIS — F12259 Cannabis dependence with psychotic disorder, unspecified: Secondary | ICD-10-CM | POA: Diagnosis not present

## 2021-01-16 DIAGNOSIS — R45851 Suicidal ideations: Secondary | ICD-10-CM | POA: Diagnosis not present

## 2021-01-16 DIAGNOSIS — F332 Major depressive disorder, recurrent severe without psychotic features: Secondary | ICD-10-CM | POA: Diagnosis not present

## 2021-01-16 DIAGNOSIS — F1510 Other stimulant abuse, uncomplicated: Secondary | ICD-10-CM | POA: Diagnosis not present

## 2021-01-16 DIAGNOSIS — Z720 Tobacco use: Secondary | ICD-10-CM

## 2021-01-16 DIAGNOSIS — F1020 Alcohol dependence, uncomplicated: Secondary | ICD-10-CM

## 2021-01-16 LAB — LIPID PANEL
Cholesterol: 199 mg/dL (ref 0–200)
HDL: 35 mg/dL — ABNORMAL LOW (ref >40)
LDL Cholesterol: 143 mg/dL — ABNORMAL HIGH (ref 0–99)
Total CHOL/HDL Ratio: 5.7 RATIO
Triglycerides: 103 mg/dL (ref <150)
VLDL: 21 mg/dL (ref 0–40)

## 2021-01-16 LAB — HEMOGLOBIN A1C
Hgb A1c MFr Bld: 5.6 % (ref 4.8–5.6)
Mean Plasma Glucose: 114.02 mg/dL

## 2021-01-16 NOTE — H&P (Signed)
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 male with PPHx of reported depression and no noted PMHx who was admitted voluntarily to the [facility] after a suicide attempt.   Per brief chart review, patient was found by his sister after ingestion of 2.5 pills of Flexeril and brought to ED via EMS. He reported that he initially separated from his  long term girlfriend in November 2021 but they had gotten back together to work things out for the kids. However, 1-2 weeks ago they separated again and he has not been allowed to see the children, leading him to feel hopeless and attempt suicide via Flexeril ingestion. He was medically cleared in ED though did report some atypical right-sided chest pain that was reproducible on palpation.   On interview, patient reports that his primary stressor and triggering factor for his suicide attempt was this breakup. He states that he was with his girlfriend for 8 years, and though he is not a biological parent, he loves and cares for their children. He reports that he felt considerable confusion and frustration, and took the pills to "sleep it away." He states that he did take the medication with intention to die.   Patient does report that around 1 year ago, he presented to [facility] for suicidal ideation. States that at that time he was admitted and stabilized with psychotherapy and Zoloft. States that he took  the Zoloft until January 2022, when he ran out of his prescription. He states that he did not know if he'd be able to get a refill, and did not know how to do so. He also got back together with longterm girlfriend around this time so felt he did not need the medication. States that between January 2022 and a few weeks ago, he did not have significant symptoms of depression. States that he did have some intermittent feelings of guilt and worthlessness but these worsened acutely since his breakup 1-2 weeks ago. He denies any declined interest in enjoyable activities or changes in concentration, appetite, or sleep over the past few months. He states that overall, he has very good social support and a fulfilling job (see social history).   When asked about auditory hallucinations, patient does report that when he is alone, he sometimes hears a voice asking "why are you here," "just go," etc. He states that it sounds like another person's voice. He reports that it only occurs when he is completely alone and did happen recently, but he is usually able to cope with it by listening to music, writing music, or engaging with others. States that over the past few months, it did not happen frequently since he was living with his girlfriend and kids. He denies VH, current SI, or any HI.   Regarding symptoms of anxiety, he states that he sometimes has excessive worry, but is typically able to talk himself out of said worry. He denies any history of trauma. He denies any symptoms of PTSD including nightmares, flashbacks, or hypervigilance.  Regarding substance use, patient endorses use of multiple substances. First cannabis use at age 35 with a period of abstinence until age 35, but 2-3x daily use since age 35. He states he uses cannabis to decrease anxiety, thought-spiraling, and to prevent the feeling of aloneness which triggers possible auditory hallucinations. First tobacco use reported at age 35, with intermittent  periods of abstinence of 2-3 weeks. Patient states he used to smoke to relieve stress, and now smokes primarily socially. He denies any current nicotine cravings or withdrawal symptoms. Patient reports first alcohol use at 89. States that he primarily drinks ~2 beers per day and only drinks liquor on special occasions. He denies ever feeling a need to have alcohol to make it through the day or feeling like he is drinking too much, but states that his mother and his ex-girlfriend have made such comments to him. He states that his ex-girlfriend last made such a comment to him 2 weeks ago due to him drinking a 12-pack in 2 days. He states that he doesn't usually have the money to drink that much, but when he does, he drinks because he enjoys beer. He states he does not intentionally drink to get intoxicated. He denies any additional substance use including any IV or nasally inhaled substances. Denies any methamphetamine use.   Per remote chart review, aforementioned psychiatric admission was likely in 04/2020. At that time patient was held in ED for 2 days and evaluated by psychiatry. He was prescribed 30 days of Zoloft 25mg  and d/ced to PHP/IOP, with sister as support. Likely ran out of Zoloft after just 1 month of treatment. At that time, he had been reporting depressed mood for several months.   Associated Signs/Symptoms: Depression Symptoms:  depressed mood, anhedonia, feelings of worthlessness/guilt, hopelessness, suicidal attempt, anxiety, Duration of Depression Symptoms: 1-2 weeks this episode, greater than 2 weeks since initial onset  (Hypo) Manic Symptoms:  Hallucinations, Impulsivity, Anxiety Symptoms:  Excessive Worry, Psychotic Symptoms:  Hallucinations: Auditory PTSD Symptoms: Negative  Total Time spent with patient:  70 minutes  Past Psychiatric History:  - [facility] ideation with a plan in 04/2020. At that time, reported several months of depression  and ultimately discharged with safety planning, PHP in care of sister.  - Previous Rx: Zoloft 25mg   - Previous dx: MDD  Is the patient at risk to self? Yes.    Has the patient been a risk to self in the past 6 months? Yes.    Has the patient been a risk to self within the distant past? Yes.    Is the patient a risk to others? No.  Has the patient been a risk to others in the past 6 months? No.  Has the patient been a risk to others within the distant past? No.   Prior Inpatient Therapy:  N/A Prior Outpatient Therapy:   PHP - 04/2020 - 05/2020 per chart review  Alcohol Screening: Patient refused Alcohol Screening Tool: Yes 1. How often do you have a drink containing alcohol?: 4 or more times a week 2. How many drinks containing alcohol do you have on a typical day when you are drinking?: 10 or more 3. How often do you have six or more drinks on one occasion?: Daily or almost daily AUDIT-C Score: 12 4. How often during the last year have you found that you were not able to stop drinking once you had started?: Monthly 5. How often during the last year have you  failed to do what was normally expected from you because of drinking?: Never 6. How often during the last year have you needed a first drink in the morning to get yourself going after a heavy drinking session?: Never 7. How often during the last year have you had a feeling of guilt of remorse after drinking?: Weekly 8. How often during the last year have you been unable to remember what happened the night before because you had been drinking?: Never 9. Have you or someone else been injured as a result of your drinking?: No 10. Has a relative or friend or a doctor or another health worker been concerned about your drinking or suggested you cut down?: No Alcohol Use Disorder Identification Test Final Score (AUDIT): 17 Alcohol Brief Interventions/Follow-up: Alcohol education/Brief advice Substance Abuse History in the last 12 months:  Yes.    Consequences of Substance Abuse: Family Consequences:  Criticism from ex-girlfriend and mother for excess ETOH use - most recent episode of drinking 12-pack in 2 days occurred shortly before most recently separation from his ex-girlfriend and being kicked out of her home.  Previous Psychotropic Medications: Yes  Psychological Evaluations: Yes  Past Medical History:  Past Medical History:  Diagnosis Date   Abscess    Bronchitis    Low back pain with bilateral sciatica, unspecified back pain laterality, unspecified chronicity     Past Surgical History:  Procedure Laterality Date   IRRIGATION AND DEBRIDEMENT ABSCESS Right 03/20/2017   Procedure: IRRIGATION AND DEBRIDEMENT ABSCESS , EUA, DRAIN PLACEMENT;  Surgeon: Andria Meuse, MD;  Location: [facility] OR;  Service: General;  Laterality: Right;   Family History: Patient denies any known family history of diabetes, HTN, heart disease, cancer, or early cardiac death.  Family History  Family history unknown: Yes   Family Psychiatric  History: Patient denies any known family history of psychiatric illnesses, suicide attempts, or suicide completion.   Tobacco Screening:  Patient reports he smokes 1 ppd since age 37. See HPI for additional details. Social History:  Social History   Substance and Sexual Activity  Alcohol Use Yes   Alcohol/week: 14.0 standard drinks   Types: 12 Cans of beer, 2 Shots of liquor per week   Comment: Per pt he drinks vodka occassionally & drinks 12 pkt beer Q 2 days"     Social History   Substance and Sexual Activity  Drug Use Yes   Types: Marijuana    Additional Social History: Marital status: Single Are you sexually active?: Yes What is your sexual orientation?: heterosexual Does patient have children?: Yes How many children?: 2 How is patient's relationship with their children?: "I see my daughter every so often, haven't seen my son since he was born. Daughter's 67, son is 21"  Patient lived with  his ex-girlfriend up until 2 weeks ago. He moved in with his father 1 week ago after separation from girlfriend. He states that his father's home is overall a supportive environment. He is working on saving for his own place, but after hospital discharge he has been invited to stay with his younger sister until he feels stable. Patient has 4 siblings, 2 older and 2 younger. He states that he is close to 3 of them, and is somewhat distanced from his oldest sister due to her friendship with his ex-girlfriend. He states he is also close to his mother and his father, who are separated.  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  He states that his work environment is supportive and he feels camaraderie with his co-workers. He also has a second job at the [employer] which he just began last week. Relationship and substance use history as noted in HPI.  He is not currently followed by a PCP and reports he has not had a regular physical since middle school.   Allergies:   Allergies  Allergen Reactions   Banana Anaphylaxis and Nausea And Vomiting   Tomato Anaphylaxis and Hives   Lab Results:  Results for orders placed or performed during the hospital encounter of 01/15/21 (from the past 48 hour(s))  TSH     Status: None   Collection Time: 01/15/21  6:23 PM  Result Value Ref Range   TSH 0.822 0.350 - 4.500 uIU/mL    Comment: Performed by a 3rd Generation assay with a functional sensitivity of <=0.01 uIU/mL. Performed at [facility], [address], [place], [place] [address]   Lipid panel     Status: Abnormal   Collection Time: 01/16/21  6:17 AM  Result Value Ref Range   Cholesterol 199 0 - 200 mg/dL   Triglycerides 604 <540 mg/dL   HDL 35 (L) >98 mg/dL   Total CHOL/HDL Ratio 5.7 RATIO   VLDL 21 0 - 40 mg/dL   LDL Cholesterol 119 (H) 0 - 99 mg/dL    Comment:        Total Cholesterol/HDL:CHD Risk Coronary Heart Disease Risk Table                      Men   Women  1/2 Average Risk   3.4   3.3  Average Risk       5.0   4.4  2 X Average Risk   9.6   7.1  3 X Average Risk  23.4   11.0        Use the calculated Patient Ratio above and the CHD Risk Table to determine the patient's CHD Risk.        ATP III CLASSIFICATION (LDL):  <100     mg/dL   Optimal  147-829  mg/dL   Near or Above                    Optimal  130-159  mg/dL   Borderline  562-130  mg/dL   High  >865     mg/dL   Very High Performed at [facility], [address] [place], [place] [address]   Hemoglobin A1c     Status: None   Collection Time: 01/16/21  6:17 AM  Result Value Ref Range   Hgb A1c MFr Bld 5.6 4.8 - 5.6 %    Comment: (NOTE) Pre diabetes:          5.7%-6.4%  Diabetes:              >6.4%  Glycemic control for   <7.0% adults with diabetes    Mean Plasma Glucose 114.02 mg/dL    Comment: Performed at [facility], [address], [place], [place] [address]    Blood Alcohol level:  Lab Results  Component Value Date   [facility] <10 01/14/2021   ETH <10 05/23/2020    Metabolic Disorder Labs:  Lab Results  Component Value Date   HGBA1C 5.6 01/16/2021   MPG 114.02 01/16/2021   No results found for: PROLACTIN Lab Results  Component Value Date   CHOL 199 01/16/2021  TRIG 103 01/16/2021   HDL 35 (L) 01/16/2021   CHOLHDL 5.7 01/16/2021   VLDL 21 01/16/2021   LDLCALC 143 (H) 01/16/2021    Current Medications: Current Facility-Administered Medications  Medication Dose Route Frequency Provider Last Rate Last Admin   acetaminophen (TYLENOL) tablet 650 mg  650 mg Oral Q6H PRN Rankin, Shuvon B, NP   650 mg at 01/15/21 1411   alum & mag hydroxide-simeth (MAALOX/MYLANTA) 200-200-20 MG/5ML suspension 30 mL  30 mL Oral Q4H PRN Rankin, Shuvon B, NP       feeding supplement (ENSURE ENLIVE / ENSURE PLUS) liquid 237 mL  237 mL Oral BID BM Laveda Abbe, NP   237 mL at 01/16/21 0901   hydrOXYzine (ATARAX/VISTARIL)  tablet 25 mg  25 mg Oral TID PRN Rankin, Shuvon B, NP   25 mg at 01/15/21 2122   magnesium hydroxide (MILK OF MAGNESIA) suspension 30 mL  30 mL Oral Daily PRN Rankin, Shuvon B, NP       nicotine (NICODERM CQ - dosed in mg/24 hours) patch 14 mg  14 mg Transdermal Daily Laveda Abbe, NP   14 mg at 01/15/21 1657   sertraline (ZOLOFT) tablet 25 mg  25 mg Oral Daily Rankin, Shuvon B, NP   25 mg at 01/16/21 0854   traZODone (DESYREL) tablet 50 mg  50 mg Oral QHS PRN Rankin, Shuvon B, NP   50 mg at 01/15/21 2122   PTA Medications: Medications Prior to Admission  Medication Sig Dispense Refill Last Dose   amoxicillin (AMOXIL) 500 MG capsule Take 2 capsules (1,000 mg total) by mouth 2 (two) times daily. (Patient not taking: No sig reported) 40 capsule 0    cyclobenzaprine (FLEXERIL) 5 MG tablet Take 1-2 tablets (5-10 mg total) by mouth 2 (two) times daily as needed for muscle spasms. (Patient taking differently: Take 7.5 mg by mouth 2 (two) times daily as needed for muscle spasms.) 24 tablet 0    predniSONE (STERAPRED UNI-PAK 21 TAB) 10 MG (21) TBPK tablet 6 tabs for 1 day, then 5 tabs for 1 das, then 4 tabs for 1 day, then 3 tabs for 1 day, 2 tabs for 1 day, then 1 tab for 1 day (Patient not taking: No sig reported) 21 tablet 0    sertraline (ZOLOFT) 25 MG tablet Take 2 tablets (50 mg total) by mouth daily. (Patient not taking: No sig reported) 30 tablet 0    traMADol (ULTRAM) 50 MG tablet Take 1 tablet (50 mg total) by mouth every 12 (twelve) hours as needed. (Patient not taking: Reported on 01/14/2021) 12 tablet 0     Psychiatric Specialty Exam:  Presentation  General Appearance:  Casual; Appropriate for Environment; Fairly Groomed Eye Contact: Good Speech: Clear and Coherent; Normal Rate Speech Volume: Normal Handedness: No data recorded  Mood and Affect  Mood: Euthymic; Anxious Affect: Congruent  Thought Process  Thought Processes: Coherent; Goal Directed; Linear Duration  of Psychotic Symptoms: Greater than six months (Pt reports voices come and go throughout life)  Past Diagnosis of Schizophrenia or Psychoactive disorder: No  Descriptions of Associations:Intact Orientation:Full (Time, Place and Person) Thought Content:Logical Hallucinations:Hallucinations: Auditory Description of Auditory Hallucinations: another person's voice saying "why are you here?" or "just go", or otherwise questioning patient's worth Ideas of Reference:None Suicidal Thoughts:Suicidal Thoughts: No Homicidal Thoughts:Homicidal Thoughts: No  Sensorium  Memory: Immediate Good; Recent Fair; Remote Fair Judgment: Fair Insight: Good  Executive Functions  Concentration: Good Attention Span: Good Recall: [employer]  Knowledge: Fair Language: Good  Psychomotor Activity  Psychomotor Activity: Psychomotor Activity: Normal  Assets  Assets: Communication Skills; Desire for Improvement; Housing; Social Support; Vocational/Educational  Sleep  Sleep: Sleep: Good - per patient report  Physical Exam: Physical Exam Vitals and nursing note reviewed.  Constitutional:      General: He is not in acute distress.    Appearance: He is not toxic-appearing.  HENT:     Head: Normocephalic and atraumatic.  Pulmonary:     Effort: Pulmonary effort is normal.  Neurological:     General: No focal deficit present.     Mental Status: He is alert and oriented to person, place, and time.   ROS Blood pressure 113/83, pulse 76, temperature 98 F (36.7 C), temperature source Oral, resp. rate 16, height  (1.854 m), weight 63.5 kg, SpO2 98 %. Body mass index is 18.47 kg/m.  Treatment Plan Summary: Daily contact with patient to assess and evaluate symptoms and progress in treatment and Medication management  Overall, his symptoms are most likely due to a recurrence of MDD with psychotic features (auditory hallucinations), given his previous episode of depression, previous suicide  attempt, and week-long decline. His cannabis use and ETOH may also be contributing to his mood instability and possible auditory hallucinations. Patient unable to describe how frequently his auditory hallucinations occur. He reports that they happen when he is alone, but also reports worsening depressive symptoms when alone. Unclear if hallucinations occur in absence of depression, or if other features of psychosis are present. Thus, schizoaffective disorder should be considered but is less likely. Patient require inpatient psychiatric hospitalization for safety, monitoring, stabilization, and re-initiation of medications.   Likely MDD (severe, recurrent, with psychotic features) vs schizoaffective disorder - Restart Zoloft  - Patient will likely benefit from supportive psychotherapy, education on coping skills, and education on medication adherence while admitted, as well as long-term outpatient follow-up (psychiatry + psychotherapy)  Cannabis use disorder  ETOH use  Tobacco use - Encourage abstinence from substances following hospital discharge - Nicotine patch available PRN, patient declined - ETOH undetectable at time of admission and pt states last time drinking more than 2 beers/day was 2 weeks ago, so CIWA protocol not indicated at this time.  - UDS: THC, amphetamines. Patient denies additional substance use than cannabis, ETOH, tobacco. Will continue conversations regarding substance use, including illicitly obtained prescriptions or possibility of contaminated marijuana.   Chronic Medical Problems - Sciatica - no regular medication  Safety: Patient with risk factors of recent suicide attempt, hx of depression and previous suicidal ideation, and significant life stressor (recent separation with girlfriend, unable to see children). Protective factors include excellent social support, fulfilling employment, housing stability, and positive outlook.  - Voluntary admission to inpatient  psychiatric unit for safety, stabilization and treatment - Daily contact with patient to assess and evaluate symptoms and progress in treatment - Patient's case to be discussed in multi-disciplinary team meeting - Observation Level : q15 minute checks - Daily vital signs - Precautions: suicide  PRNs: - Tylenol  q6h - pain - maalox/mylanta - indigestion - milk of magnesia 30mL suspension - mild constipation - Trazodone  qhs - sleep - Hydroxyzine  TID - anxiety  Safety: Patient with risk factors of recent suicide attempt, hx of depression and previous suicidal ideation, and significant life stressor (recent separation with girlfriend, unable to see children). Protective factors include excellent social support, fulfilling employment, housing stability, and positive outlook.  - Voluntary admission to inpatient psychiatric  unit for safety, stabilization and treatment - Daily contact with patient to assess and evaluate symptoms and progress in treatment - Patient's case to be discussed in multi-disciplinary team meeting - Observation Level : q15 minute checks - Daily vital signs - Precautions: suicide  Observation Level/Precautions:  15 minute checks  Laboratory:   - CBC: WBC, H/H, platelets unremarkable.  - CMP: Electrolytes unremarkable. LFTs unremarkable. Total bilirubin elevated to 2.3 - UDS: THC, amphetamines - UA: unremarkable - TSH: 0.822 - Blood glucose: elevated to 107. HbA1c 5.6 (WNL) - Lipids: HDL low at 35, LDL borderline at 143  Psychotherapy: Supportive, group  Medications:  Zoloft 25mg  for depression; trazodone 50 mg as needed for sleep  Consultations:  as needed  Discharge Concerns:  Safety planning,   Estimated LOS: 3-5 days  Other:     Physician Treatment Plan for Primary Diagnosis: Suicidal ideation Long Term Goal(s): Improvement in symptoms so as ready for discharge  Short Term Goals: Ability to disclose and discuss suicidal ideas, Ability to  demonstrate self-control will improve, Ability to identify and develop effective coping behaviors will improve, and Compliance with prescribed medications will improve  Physician Treatment Plan for Secondary Diagnosis: Principal Problem:   Suicidal ideation Active Problems:   MDD (major depressive disorder), recurrent episode, severe (HCC)   Tobacco abuse   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)   Moderate alcohol use disorder (HCC)   Amphetamine abuse (HCC)  Long Term Goal(s): Improvement in symptoms so as ready for discharge  Short Term Goals: Ability to identify changes in lifestyle to reduce recurrence of condition will improve, Ability to verbalize feelings will improve, Ability to demonstrate self-control will improve, Ability to identify and develop effective coping behaviors will improve, Compliance with prescribed medications will improve, and Ability to identify triggers associated with substance abuse/mental health issues will improve  I certify that inpatient services furnished can reasonably be expected to improve the patient's condition.    , MS3, contributed to this note.  Mallie Snooks, MD 8/23/20222:40 PM
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
## 2021-01-16 NOTE — Progress Notes (Signed)
   01/16/21 2030  Psych Admission Type (Psych Patients Only)  Admission Status Voluntary  Psychosocial Assessment  Patient Complaints None  Eye Contact Brief  Facial Expression Anxious  Affect Anxious  Speech Logical/coherent  Interaction Assertive  Motor Activity Other (Comment) (wnl)  Appearance/Hygiene In scrubs  Behavior Characteristics Cooperative  Mood Anxious  Thought Process  Coherency WDL  Content WDL  Delusions None reported or observed  Perception WDL  Hallucination None reported or observed  Judgment WDL  Confusion None  Danger to Self  Current suicidal ideation? Denies  Danger to Others  Danger to Others None reported or observed   Pt says he is anxious to get back to work. Pt denies SI, HI, AVH and pain. Denies anxiety and depression. Pt says he has a strong support system outside of [facility]. "My coworkers are like my brothers. My mom and my sister are both there to help me. I will keep taking my Zoloft. It keeps me right where I need to be mentally. I should never have stopped it.I am going to start therapy and some other things I want to put in place." Pt told to ask provider about discharge. In the meantime, pt encouraged to stay active in milieu and attend groups to keep himself form self-isolating and getting lost in his thoughts. Pt verbalizes understanding.

## 2021-01-16 NOTE — [facility] Group Notes (Signed)
Patient did not attend group.   Spiritual care group on grief and loss facilitated by chaplain Katy Jaecion Dempster, [facility]   Group Goal:   Support / Education around grief and loss   Members engage in facilitated group support and psycho-social education.   Group Description:   Following introductions and group rules, group members engaged in facilitated group dialog and support around topic of loss, with particular support around experiences of loss in their lives. Group Identified types of loss (relationships / self / things) and identified patterns, circumstances, and changes that precipitate losses. Reflected on thoughts / feelings around loss, normalized grief responses, and recognized variety in grief experience. Group noted Worden's four tasks of grief in discussion.   Group drew on Adlerian / Rogerian, narrative, MI 

## 2021-01-16 NOTE — Progress Notes (Signed)
Adult Psychoeducational Group Note  Date:  01/16/2021 Time:  10:18 PM  Group Topic/Focus:  Wrap-Up Group:   The focus of this group is to help patients review their daily goal of treatment and discuss progress on daily workbooks.  Participation Level:  Active  Participation Quality:  Appropriate and Attentive  Affect:  Appropriate  Cognitive:  Alert and Appropriate  Insight: Appropriate and Good  Engagement in Group:  Engaged  Modes of Intervention:  Discussion  Additional Comments: Review patient's goals, progress, and objectives on this date and examine group skills pertaining to treatment. Discuss symptoms, supportive counseling, identification, and exploration of emotions. In addition, we discussed substituting negative/maladaptive thoughts or images with positive/adaptive ones. Pt self-reporting has an overall day of 10 out of 10 on this date. End of Wrap-Up Group progress note.   Nicoletta Dress 01/16/2021, 10:18 PM

## 2021-01-16 NOTE — [facility] Suicide Risk Assessment (Signed)
[facility] Admission Suicide Risk Assessment   Nursing information obtained from:  Patient Demographic factors:  Male, Adolescent or young adult, Low socioeconomic status Current Mental Status:  Suicidal ideation indicated by patient, Self-harm thoughts, Self-harm behaviors Loss Factors:  Loss of significant relationship, Decrease in vocational status, Financial problems / change in socioeconomic status (recent breakup with girlfriend of 8 years) Historical Factors:  Prior suicide attempts, Impulsivity ("I overdosed on muscle relaxer") Risk Reduction Factors:  Responsible for children under 28 years of age, Sense of responsibility to family, Religious beliefs about death, Employed, Living with another person, especially a relative, Positive social support  Total Time spent with patient:  70 minutes Principal Problem: Suicidal ideation Diagnosis:  Principal Problem:   Suicidal ideation Active Problems:   MDD (major depressive disorder), recurrent episode, severe (HCC)   Tobacco abuse   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  We have been together for 8 years.  I overdosed on Sunday, but now I want to stay alive.  I have good support from my family.  I want to go back to work.  I have auditory hallucinations when I am alone, but when I am with others I do not have them as much.  I do smoke marijuana 2-3 joints daily since I was 35 years old to manage my anxiety.  NA drink at least 2 beers daily.  My goal is to be a better me."  History of Present Illness: Ronnie Hoover is a 35 y.o. male with PPHx of reported depression and no noted PMHx who was admitted voluntarily to the [facility] after a suicide attempt.    Per brief chart review, patient was found by his sister after ingestion  of 2.5 pills of Flexeril and brought to ED via EMS. He reported that he initially separated from his  long term girlfriend in November 2021 but they had gotten back together to work things out for the kids. However, 1-2 weeks ago they separated again and he has not been allowed to see the children, leading him to feel hopeless and attempt suicide via Flexeril ingestion. He was medically cleared in ED though did report some atypical right-sided chest pain that was reproducible on palpation.    On interview, patient reports that his primary stressor and triggering factor for his suicide attempt was this breakup. He states that he was with his girlfriend for 8 years, and though he is not a biological parent, he loves and cares for their children. He reports that he felt considerable confusion and frustration, and took the pills to "sleep it away." He states that he did take the medication with intention to die.    Patient does report that around 1 year ago, he presented to [facility] for suicidal ideation. States that at that time he was admitted and stabilized with psychotherapy and Zoloft. States that he took the Zoloft until January 2022, when he ran out of his prescription. He states that he did not know if he'd be able to get a refill, and did not know how to do so. He also got back together with longterm girlfriend around this time so felt he did not need the medication. States that between January 2022 and a few weeks ago, he did not have significant symptoms of  depression. States that he did have some intermittent feelings of guilt and worthlessness but these worsened acutely since his breakup 1-2 weeks ago. He denies any declined interest in enjoyable activities or changes in concentration, appetite, or sleep over the past few months. He states that overall, he has very good social support and a fulfilling job (see social history).    When asked about auditory hallucinations, patient does report that when he  is alone, he sometimes hears a voice asking "why are you here," "just go," etc. He states that it sounds like another person's voice. He reports that it only occurs when he is completely alone and did happen recently, but he is usually able to cope with it by listening to music, writing music, or engaging with others. States that over the past few months, it did not happen frequently since he was living with his girlfriend and kids. He denies VH, current SI, or any HI.    Regarding symptoms of anxiety, he states that he sometimes has excessive worry, but is typically able to talk himself out of said worry. He denies any history of trauma. He denies any symptoms of PTSD including nightmares, flashbacks, or hypervigilance.    Regarding substance use, patient endorses use of multiple substances. First cannabis use at age 38 with a period of abstinence until age 56, but 2-3x daily use since age 27. He states he uses cannabis to decrease anxiety, thought-spiraling, and to prevent the feeling of aloneness which triggers possible auditory hallucinations. First tobacco use reported at age 68, with intermittent periods of abstinence of 2-3 weeks. Patient states he used to smoke to relieve stress, and now smokes primarily socially. He denies any current nicotine cravings or withdrawal symptoms. Patient reports first alcohol use at 42. States that he primarily drinks ~2 beers per day and only drinks liquor on special occasions. He denies ever feeling a need to have alcohol to make it through the day or feeling like he is drinking too much, but states that his mother and his ex-girlfriend have made such comments to him. He states that his ex-girlfriend last made such a comment to him 2 weeks ago due to him drinking a 12-pack in 2 days. He states that he doesn't usually have the money to drink that much, but when he does, he drinks because he enjoys beer. He states he does not intentionally drink to get intoxicated. He  denies any additional substance use including any IV or nasally inhaled substances. Denies any methamphetamine use.    Per remote chart review, aforementioned psychiatric admission was likely in 04/2020. At that time patient was held in ED for 2 days and evaluated by psychiatry. He was prescribed 30 days of Zoloft 25mg  and d/ced to PHP/IOP, with sister as support. Likely ran out of Zoloft after just 1 month of treatment. At that time, he had been reporting depressed mood for several months.    Associated Signs/Symptoms: Depression Symptoms:  depressed mood, anhedonia, feelings of worthlessness/guilt, hopelessness, suicidal attempt, anxiety, Duration of Depression Symptoms: 1-2 weeks this episode, greater than 2 weeks since initial onset   (Hypo) Manic Symptoms:  Hallucinations, Impulsivity, Anxiety Symptoms:  Excessive Worry, Psychotic Symptoms:  Hallucinations: Auditory PTSD Symptoms: Negative   Total Time spent with patient:  70 minutes   Past Psychiatric History:  - Previously presented to [facility] for suicidal ideation with a plan in 04/2020. At that time, reported several months of depression and ultimately discharged with safety planning, PHP in care  of sister.  - Previous Rx: Zoloft 25mg   - Previous dx: MDD   Is the patient at risk to self? Yes.    Has the patient been a risk to self in the past 6 months? Yes.    Has the patient been a risk to self within the distant past? Yes.    Is the patient a risk to others? No.  Has the patient been a risk to others in the past 6 months? No.  Has the patient been a risk to others within the distant past? No.    Prior Inpatient Therapy:  N/A Prior Outpatient Therapy:   PHP - 04/2020 - 05/2020 per chart review   Continued Clinical Symptoms:  Alcohol Use Disorder Identification Test Final Score (AUDIT): 17 The "Alcohol Use Disorders Identification Test", Guidelines for Use in Primary Care, Second Edition.  World Science writerHealth Organization  [facility](WHO). Score between 0-7:  no or low risk or alcohol related problems. Score between 8-15:  moderate risk of alcohol related problems. Score between 16-19:  high risk of alcohol related problems. Score 20 or above:  warrants further diagnostic evaluation for alcohol dependence and treatment.   CLINICAL FACTORS:   Severe Anxiety and/or Agitation Depression:   Comorbid alcohol abuse/dependence Insomnia Severe Alcohol/Substance Abuse/Dependencies   Musculoskeletal: Strength & Muscle Tone: within normal limits Gait & Station: normal Patient leans: N/A  Psychiatric Specialty Exam:  Presentation  General Appearance: Casual; Appropriate for Environment; Fairly Groomed  Eye Contact:Good  Speech:Clear and Coherent; Normal Rate  Speech Volume:Normal  Handedness: No data recorded  Mood and Affect  Mood:Euthymic; Anxious  Affect:Congruent   Thought Process  Thought Processes:Coherent; Goal Directed; Linear  Descriptions of Associations:Intact  Orientation:Full (Time, Place and Person)  Thought Content:Logical  History of Schizophrenia/Schizoaffective disorder:No  Duration of Psychotic Symptoms:Greater than six months (Pt reports voices come and go throughout life)  Hallucinations:Hallucinations: Auditory Description of Auditory Hallucinations: another person's voice saying "why are you here?" or "just go", or otherwise questioning patient's worth  Ideas of Reference:None  Suicidal Thoughts:Suicidal Thoughts: No  Homicidal Thoughts:Homicidal Thoughts: No   Sensorium  Memory:Immediate Good; Recent Fair; Remote Fair  Judgment:Fair  Insight:Good   Executive Functions  Concentration:Good  Attention Span:Good  Recall:Fair  Fund of Knowledge:Fair  Language:Good   Psychomotor Activity  Psychomotor Activity:Psychomotor Activity: Normal   Assets  Assets:Communication Skills; Desire for Improvement; Housing; Social Support;  Vocational/Educational   Sleep  Sleep:Sleep: Good    Physical Exam: Vitals and nursing note reviewed.  Constitutional:      General: He is not in acute distress.    Appearance: He is not toxic-appearing.  HENT:     Head: Normocephalic and atraumatic.  Pulmonary:     Effort: Pulmonary effort is normal.  Neurological:     General: No focal deficit present.     Mental Status: He is alert and oriented to person, place, and time.     Review of Systems  Constitutional: Negative.   HENT: Negative.    Respiratory: Negative.    Cardiovascular: Negative.   Gastrointestinal: Negative.   Musculoskeletal: Negative.   Neurological: Negative.   Psychiatric/Behavioral:  Positive for depression, hallucinations, substance abuse and suicidal ideas. Negative for memory loss. The patient is nervous/anxious and has insomnia.   Blood pressure 113/83, pulse 76, temperature 98 F (36.7 C), temperature source Oral, resp. rate 16, height 6\' 1"  (1.854 m), weight 63.5 kg, SpO2 98 %. Body mass index is 18.47 kg/m.   COGNITIVE FEATURES THAT CONTRIBUTE TO RISK:  Polarized thinking    SUICIDE RISK:   Severe:  Frequent, intense, and enduring suicidal ideation, specific plan, no subjective intent, but some objective markers of intent (i.e., choice of lethal method), the method is accessible, some limited preparatory behavior, evidence of impaired self-control, severe dysphoria/symptomatology, multiple risk factors present, and few if any protective factors, particularly a lack of social support.  PLAN OF CARE:   Daily contact with patient to assess and evaluate symptoms and progress in treatment and Medication management   Overall, his symptoms are most likely due to a recurrence of MDD with psychotic features (auditory hallucinations), given his previous episode of depression, previous suicide attempt, and week-long decline. His cannabis use and ETOH may also be contributing to his mood instability and  possible auditory hallucinations. Patient unable to describe how frequently his auditory hallucinations occur. He reports that they happen when he is alone, but also reports worsening depressive symptoms when alone. Unclear if hallucinations occur in absence of depression, or if other features of psychosis are present. Thus, schizoaffective disorder should be considered but is less likely. Patient require inpatient psychiatric hospitalization for safety, monitoring, stabilization, and re-initiation of medications.    Likely MDD (severe, recurrent, with psychotic features) vs schizoaffective disorder - Restart Zoloft  - Patient will likely benefit from supportive psychotherapy, education on coping skills, and education on medication adherence while admitted, as well as long-term outpatient follow-up (psychiatry + psychotherapy)   Cannabis use disorder  ETOH use  Tobacco use - Encourage abstinence from substances following hospital discharge - Nicotine patch available PRN, patient declined - ETOH undetectable at time of admission and pt states last time drinking more than 2 beers/day was 2 weeks ago, so CIWA protocol not indicated at this time.  - UDS: THC, amphetamines. Patient denies additional substance use than cannabis, ETOH, tobacco. Will continue conversations regarding substance use, including illicitly obtained prescriptions or possibility of contaminated marijuana.    Chronic Medical Problems - Sciatica - no regular medication   Safety: Patient with risk factors of recent suicide attempt, hx of depression and previous suicidal ideation, and significant life stressor (recent separation with girlfriend, unable to see children). Protective factors include excellent social support, fulfilling employment, housing stability, and positive outlook.  - Voluntary admission to inpatient psychiatric unit for safety, stabilization and treatment - Daily contact with patient to assess and evaluate  symptoms and progress in treatment - Patient's case to be discussed in multi-disciplinary team meeting - Observation Level : q15 minute checks - Daily vital signs - Precautions: suicide   PRNs: - Tylenol  q6h - pain - maalox/mylanta - indigestion - milk of magnesia 30mL suspension - mild constipation - Trazodone  qhs - sleep - Hydroxyzine  TID - anxiety   Safety: Patient with risk factors of recent suicide attempt, hx of depression and previous suicidal ideation, and significant life stressor (recent separation with girlfriend, unable to see children). Protective factors include excellent social support, fulfilling employment, housing stability, and positive outlook.  - Voluntary admission to inpatient psychiatric unit for safety, stabilization and treatment - Daily contact with patient to assess and evaluate symptoms and progress in treatment - Patient's case to be discussed in multi-disciplinary team meeting - Observation Level : q15 minute checks - Daily vital signs - Precautions: suicide   Observation Level/Precautions:  15 minute checks  Laboratory:   - CBC: WBC, H/H, platelets unremarkable.  - CMP: Electrolytes unremarkable. LFTs unremarkable. Total bilirubin elevated to 2.3 - UDS: THC, amphetamines - UA:  unremarkable - TSH: 0.822 - Blood glucose: elevated to 107. HbA1c 5.6 (WNL) - Lipids: HDL low at 35, LDL borderline at 143  Psychotherapy: Supportive, group  Medications:  Zoloft 25mg  for depression; trazodone 50 mg as needed for sleep  Consultations:  as needed  Discharge Concerns:  Safety planning,   Estimated LOS: 3-5 days  Other:      Physician Treatment Plan for Primary Diagnosis: Suicidal ideation Long Term Goal(s): Improvement in symptoms so as ready for discharge   Short Term Goals: Ability to disclose and discuss suicidal ideas, Ability to demonstrate self-control will improve, Ability to identify and develop effective coping behaviors will  improve, and Compliance with prescribed medications will improve   Physician Treatment Plan for Secondary Diagnosis: Principal Problem:   Suicidal ideation Active Problems:   MDD (major depressive disorder), recurrent episode, severe (HCC)   Tobacco abuse   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)   Moderate alcohol use disorder (HCC)   Amphetamine abuse (HCC)   Long Term Goal(s): Improvement in symptoms so as ready for discharge   Short Term Goals: Ability to identify changes in lifestyle to reduce recurrence of condition will improve, Ability to verbalize feelings will improve, Ability to demonstrate self-control will improve, Ability to identify and develop effective coping behaviors will improve, Compliance with prescribed medications will improve, and Ability to identify triggers associated with substance abuse/mental health issues will improve   I certify that inpatient services furnished can reasonably be expected to improve the patient's condition.       I certify that inpatient services furnished can reasonably be expected to improve the patient's condition.   , MD 01/16/2021, 6:45 PM

## 2021-01-16 NOTE — Progress Notes (Signed)
   01/16/21 1200  Psych Admission Type (Psych Patients Only)  Admission Status Voluntary  Psychosocial Assessment  Patient Complaints None  Eye Contact Brief  Facial Expression Other (Comment) (appropriate)  Affect Anxious  Speech Logical/coherent  Interaction Assertive  Motor Activity Other (Comment) (wnl)  Appearance/Hygiene In scrubs  Behavior Characteristics Cooperative  Mood Preoccupied  Thought Process  Coherency WDL  Content WDL  Delusions WDL  Perception WDL  Hallucination None reported or observed  Judgment Poor  Confusion None  Danger to Self  Current suicidal ideation? Denies  Danger to Others  Danger to Others None reported or observed

## 2021-01-16 NOTE — Progress Notes (Signed)
NUTRITION ASSESSMENT  Pt identified as at risk on the Malnutrition Screen Tool  INTERVENTION: 1. Supplements: Ensure Plus PO BID, each provides 350 kcals and 13g protein  NUTRITION DIAGNOSIS: Unintentional weight loss related to sub-optimal intake as evidenced by pt report.   Goal: Pt to meet >/= 90% of their estimated nutrition needs.  Monitor:  PO intake  Assessment:  Pt admitted for depression and attempted drug overdose. Pt reports drinking EtOH.  Per weight records, no weight loss noted. Pt is underweight based on BMI. Ensure supplements have been ordered.   Height: Ht Readings from Last 1 Encounters:  01/15/21 6\' 1"  (1.854 m)    Weight: Wt Readings from Last 1 Encounters:  01/15/21 63.5 kg    Weight Hx: Wt Readings from Last 10 Encounters:  01/15/21 63.5 kg  01/14/21 63.5 kg  05/23/20 56.7 kg  02/07/20 57.2 kg  04/30/18 59 kg  04/08/17 60.8 kg  03/20/17 61.2 kg  02/18/17 59 kg  09/23/16 65.8 kg  12/29/15 58.6 kg    BMI:  Body mass index is 18.47 kg/m. Pt meets criteria for underweight based on current BMI.  Estimated Nutritional Needs: Kcal: 25-30 kcal/kg Protein: > 1 gram protein/kg Fluid: 1 ml/kcal  Diet Order:  Diet Order             Diet Heart Room service appropriate? Yes; Fluid consistency: Thin  Diet effective now                  Pt is also offered choice of unit snacks mid-morning and mid-afternoon.  Pt is eating as desired.   Lab results and medications reviewed.   02/28/16, MS, RD, LDN Inpatient Clinical Dietitian Contact information available via Amion

## 2021-01-17 DIAGNOSIS — R45851 Suicidal ideations: Secondary | ICD-10-CM | POA: Diagnosis not present

## 2021-01-17 DIAGNOSIS — F1510 Other stimulant abuse, uncomplicated: Secondary | ICD-10-CM | POA: Diagnosis not present

## 2021-01-17 DIAGNOSIS — F12259 Cannabis dependence with psychotic disorder, unspecified: Secondary | ICD-10-CM | POA: Diagnosis not present

## 2021-01-17 DIAGNOSIS — F332 Major depressive disorder, recurrent severe without psychotic features: Secondary | ICD-10-CM | POA: Diagnosis not present

## 2021-01-17 NOTE — Progress Notes (Signed)
[facility]    Patient Name: Ronnie QuaWilliam T Izard  MRN: [id number]  DOB: 1986-01-30   Ronnie Hoover is a 35 y.o. male with PPHx of reported MDD and no noted PMHx who [facility] after a suicide attempt on 01/14/2021, following psychosocial stressor of break-up with his longterm girlfriend and being refused contact with their children.  Total Time Spent in Direct Patient Care:  I personally spent 25 minutes on the unit in direct patient care. The direct patient care time included face-to-face time with the patient, reviewing the patient's chart, communicating with other professionals, and coordinating care. Greater than 50% of this time was spent in counseling or coordinating care with the patient regarding goals of hospitalization, psycho-education, and discharge planning needs.   Hospital day 2  24-Hour Events:  The patient's chart and nursing notes were reviewed. The patient's case was discussed in multidisciplinary team meeting. Per MAR: - Patient is compliant with scheduled meds. - PRNs: Hydroxyzine 25mg  for anxiety and trazodone 50mg  for sleep on 8/23 @ ~2100 Per RN notes, patient has attended groups and no behavioral issues. Patient slept Number of Hours: 6.75  Subjective: The patient was seen and evaluated on the unit. On assessment today, he reports a very good mood, appetite, and sleep. He states that he is ready to go home. States that he is happy to attend outpatient group therapy, follow with a psychiatrist, and rely on his external support system. When asked what he would do if he experienced SI after discharge, he reported he would call his community resources such as a therapist or [facility], or call his mother or sister. He denies SI, HI, AVH. Denies ideas of reference, paranoia, delusions, or first rank symptoms. Denies chest pain, shortness of breath, nausea, vomiting, diarrhea, constipation, or dizziness. He states that he is tolerating  the Zoloft well.  Patient denies cravings for substances to include marijuana, tobacco, amphetamines and alcohol.  Patient is able to contract for safety.  Past Psychiatric History: - [facility] with a plan in 04/2020. At that time, reported several months of depression and ultimately discharged with safety planning, PHP in care of sister.  - Previous Rx: Zoloft 25mg   - Previous dx: MDD  Past Medical History: Past Medical History:  Diagnosis Date   Abscess    Bronchitis    Low back pain with bilateral sciatica, unspecified back pain laterality, unspecified chronicity      Past Surgical History: Past Surgical History:  Procedure Laterality Date   IRRIGATION AND DEBRIDEMENT ABSCESS Right 03/20/2017   Procedure: IRRIGATION AND DEBRIDEMENT ABSCESS , EUA, DRAIN PLACEMENT;  Surgeon: Andria MeuseWhite, Christopher M, MD;  Location: [facility] OR;  Service: General;  Laterality: Right;      Family History: Family History  Family history unknown: Yes     Allergies: Allergies  Allergen Reactions   Banana Anaphylaxis and Nausea And Vomiting   Tomato Anaphylaxis and Hives    Current Medications:  Current Facility-Administered Medications:    acetaminophen (TYLENOL) tablet 650 mg, 650 mg, Oral, Q6H PRN, Rankin, Shuvon B, NP, 650 mg at 01/15/21 1411   alum & mag hydroxide-simeth (MAALOX/MYLANTA) 200-200-20 MG/5ML suspension 30 mL, 30 mL, Oral, Q4H PRN, Rankin, Shuvon B, NP   feeding supplement (ENSURE ENLIVE / ENSURE PLUS) liquid 237 mL, 237 mL, Oral, BID BM, Laveda AbbeParks, Laurie Britton, NP, 237 mL at 01/16/21 0901   hydrOXYzine (ATARAX/VISTARIL) tablet 25 mg, 25 mg, Oral,  TID PRN, Rankin, Shuvon B, NP, 25 mg at 01/16/21 2118   magnesium hydroxide (MILK OF MAGNESIA) suspension 30 mL, 30 mL, Oral, Daily PRN, Rankin, Shuvon B, NP   nicotine (NICODERM CQ - dosed in mg/24 hours) patch 14 mg, 14 mg, Transdermal, Daily, Laveda Abbe, NP, 14 mg at 01/15/21 1657    sertraline (ZOLOFT) tablet 25 mg, 25 mg, Oral, Daily, Rankin, Shuvon B, NP, 25 mg at 01/17/21 0749   traZODone (DESYREL) tablet 50 mg, 50 mg, Oral, QHS PRN, Rankin, Shuvon B, NP, 50 mg at 01/16/21 2117  Social History: Social History   Socioeconomic History   Marital status: Single    Spouse name: Not on file   Number of children: Not on file   Years of education: Not on file   Highest education level: Not on file  Occupational History   Not on file  Tobacco Use   Smoking status: Every Day    Packs/day: 1.00    Types: Cigarettes   Smokeless tobacco: Never  Vaping Use   Vaping Use: Never used  Substance and Sexual Activity   Alcohol use: Yes    Alcohol/week: 14.0 standard drinks    Types: 12 Cans of beer, 2 Shots of liquor per week    Comment: Per pt he drinks vodka occassionally & drinks 12 pkt beer Q 2 days"   Drug use: Yes    Types: Marijuana   Sexual activity: Not on file  Other Topics Concern   Not on file  Social History Narrative   Not on file   Social Determinants of Health   Financial Resource Strain: Not on file  Food Insecurity: Not on file  Transportation Needs: Not on file  Physical Activity: Not on file  Stress: Not on file  Social Connections: Not on file     Review of Systems: Review of Systems  Constitutional:  Negative for fever.  Respiratory:  Negative for shortness of breath.   Cardiovascular:  Negative for chest pain.  Gastrointestinal:  Negative for abdominal pain, constipation, diarrhea, nausea and vomiting.  Neurological:  Negative for headaches.   Objective: Psychiatric Specialty Exam: Blood pressure (!) 139/113, pulse 82, temperature 98.2 F (36.8 C), temperature source Oral, resp. rate 18, height 6\' 1"  (1.854 m), weight 63.5 kg, SpO2 99 %.Body mass index is 18.47 kg/m.  General Appearance: Casual and Fairly Groomed  Eye Contact:  Good  Speech:  Clear and Coherent and Normal Rate  Volume:  Normal  Mood:  Anxious and Euthymic   Affect:  Congruent  Thought Process:  Coherent, Goal Directed, and Linear  Orientation:  Full (Time, Place, and Person)  Thought Content:  Logical, preoccupied with wanting to be discharged  Suicidal Thoughts:  No  Homicidal Thoughts:  No  Memory:  Immediate;   Good Recent;   Fair  Judgement:  Fair  Insight:  Present  Psychomotor Activity:  Normal  Concentration:  Concentration: Good and Attention Span: Good  Recall:  Good  Fund of Knowledge:  Fair  Language:  Good  Akathisia:  No  AIMS (if indicated):    Not indicated  Assets:  Communication Skills Desire for Improvement Housing Social Support Vocational/Educational  ADL's:  Intact  Cognition:  WNL  Sleep:  Number of Hours: 6.75   Physical Exam: GEN: Well developed, in NAD HENT: NCAT, grossly nl movement of neck.  RESP: Normal respiratory effort, breathing comfortably on RA NEURO: No focal deficits noted. EXT: Able to ambulate, grossly nl movement of  extremities   Labs: Basic Metabolic Panel: BMP Latest Ref Rng & Units 01/14/2021 05/23/2020 03/20/2017  Glucose 70 - 99 mg/dL 947(M) 546(T) 035(W)  BUN 6 - 20 mg/dL 7 7 5(L)  Creatinine 6.56 - 1.24 mg/dL 8.12 7.51 7.00  Sodium 135 - 145 mmol/L 135 138 138  Potassium 3.5 - 5.1 mmol/L 4.7 4.2 3.8  Chloride 98 - 111 mmol/L 103 106 106  CO2 22 - 32 mmol/L 24 24 24   Calcium 8.9 - 10.3 mg/dL 9.8 9.5 9.3    Complete Metabolic Panel: CMP Latest Ref Rng & Units 01/14/2021 05/23/2020 04/08/2017  Glucose 70 - 99 mg/dL 04/10/2017) 174(B) -  BUN 6 - 20 mg/dL 7 7 -  Creatinine 449(Q - 1.24 mg/dL 7.59 1.63 -  Sodium 8.46 - 145 mmol/L 135 138 -  Potassium 3.5 - 5.1 mmol/L 4.7 4.2 -  Chloride 98 - 111 mmol/L 103 106 -  CO2 22 - 32 mmol/L 24 24 -  Calcium 8.9 - 10.3 mg/dL 9.8 9.5 -  Total Protein 6.5 - 8.1 g/dL 7.7 8.1 8.1  Total Bilirubin 0.3 - 1.2 mg/dL 2.3(H) 0.6 1.0  Alkaline Phos 38 - 126 U/L 58 57 56  AST 15 - 41 U/L 37 22 15  ALT 0 - 44 U/L 19 18 10     Complete Blood  Count: CBC Latest Ref Rng & Units 01/14/2021 05/23/2020 04/08/2017  WBC 4.0 - 10.5 K/uL 7.5 7.2 6.0  Hemoglobin 13.0 - 17.0 g/dL 05/25/2020 04/10/2017 93.5  Hematocrit 39.0 - 52.0 % 48.8 49.0 47.3  Platelets 150 - 400 K/uL 269 271 336    Urinalysis:    Component Value Date/Time   COLORURINE YELLOW 01/14/2021 1403   APPEARANCEUR CLEAR 01/14/2021 1403   LABSPEC 1.009 01/14/2021 1403   PHURINE 6.0 01/14/2021 1403   GLUCOSEU NEGATIVE 01/14/2021 1403   HGBUR NEGATIVE 01/14/2021 1403   BILIRUBINUR NEGATIVE 01/14/2021 1403   KETONESUR NEGATIVE 01/14/2021 1403   PROTEINUR NEGATIVE 01/14/2021 1403   UROBILINOGEN 1.0 12/24/2010 2312   NITRITE NEGATIVE 01/14/2021 1403   LEUKOCYTESUR NEGATIVE 01/14/2021 1403   Drugs of Abuse:     Component Value Date/Time   LABOPIA NONE DETECTED 01/14/2021 1402   COCAINSCRNUR NONE DETECTED 01/14/2021 1402   LABBENZ NONE DETECTED 01/14/2021 1402   AMPHETMU POSITIVE (A) 01/14/2021 1402   THCU POSITIVE (A) 01/14/2021 1402   LABBARB NONE DETECTED 01/14/2021 1402     Alcohol Level:    Component Value Date/Time   ETH <10 01/14/2021 1410    Lipid Panel:     Component Value Date/Time   CHOL 199 01/16/2021 0617   TRIG 103 01/16/2021 0617   HDL 35 (L) 01/16/2021 0617   CHOLHDL 5.7 01/16/2021 0617   VLDL 21 01/16/2021 0617   LDLCALC 143 (H) 01/16/2021 0617    TSH: 0.822 (01/15/2021)  Hgb A1c: 5.6 (01/16/2021)  Assessment/Plan:  Treatment Plan Summary:  Principal Problem:   Suicidal ideation Active Problems:   MDD (major depressive disorder), recurrent episode, severe (HCC)   Tobacco abuse   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 URBAN NAVAL is a 35 y.o. male with a PPHx of MDD (severe, recurrent, with psychotic feature), tobacco  use, cannabis use disorder, and alcohol use who presented after a suicide attempt in which he attempted to overdose  on Flexeril, following breakup with his longterm girlfriend. Today, patient reports feeling good, denying SI, HI, AVH, and states that he wants to go home. Patient states he will go stay with his sister, Carolyne Littles, when discharged, and states that he will have excellent social support from her. He has consented for psychiatric team to contact Gayleen Orem [phone]), patient's sister, for collateral, discharge (safety and follow-up) planning.  Likely MDD (severe, recurrent, with psychotic features) vs schizoaffective disorder - Continue Zoloft 25 mg, patient is tolerating well.   Cannabis use disorder  ETOH use  Tobacco use - Encourage abstinence from substances following hospital discharge - Nicotine patch available PRN, patient declined - UDS: THC, amphetamines. Patient denies additional substance use than cannabis, ETOH, tobacco. Will continue conversations regarding substance use, including illicitly obtained prescriptions or possibility of contaminated marijuana.    Chronic Medical Problems - Sciatica - no regular medication   Safety: Patient with risk factors of recent suicide attempt, hx of depression and previous suicidal ideation, and significant life stressor (recent separation with girlfriend, unable to see children). Protective factors include excellent social support, fulfilling employment, housing stability, and positive outlook.  - Voluntary admission to inpatient psychiatric unit for safety, stabilization and treatment - Daily contact with patient to assess and evaluate symptoms and progress in treatment - Patient's case to be discussed in multi-disciplinary team meeting - Observation Level : q15 minute checks - Daily vital signs - Precautions: suicide   PRNs: - Tylenol 650mg  q6h - pain - Maalox/Mylanta - indigestion - milk of magnesia 30mL suspension -  mild constipation - Trazodone 50mg  qhs - sleep - Hydroxyzine 25mg  TID - anxiety  Discharge planning and effect.  Appreciate social work assistance.   -----  31m, MS3, contributed to this note.   , MD  [facility] 01/17/21

## 2021-01-17 NOTE — [facility] Group Notes (Signed)
Type of Therapy and Topic:  Group Therapy - Healthy vs Unhealthy Coping Skills  Participation Level:  Active   Description of Group The focus of this group was to determine what unhealthy coping techniques typically are used by group members and what healthy coping techniques would be helpful in coping with various problems. Patients were guided in becoming aware of the differences between healthy and unhealthy coping techniques. Patients were asked to identify 2-3 healthy coping skills they would like to learn to use more effectively.  Therapeutic Goals 1. Patients learned that coping is what human beings do all day long to deal with various situations in their lives 2. Patients defined and discussed healthy vs unhealthy coping techniques 3. Patients identified their preferred coping techniques and identified whether these were healthy or unhealthy 4. Patients determined 2-3 healthy coping skills they would like to become more familiar with and use more often. 5. Patients provided support and ideas to each other   Summary of Patient Progress:  Pt was provided with a worksheet to complete and share with the group.  Pt accepted this worksheet and was appropriate.  Pt was encouraged to speak with the social worker if they had any questions or concerns.  

## 2021-01-17 NOTE — Progress Notes (Signed)
Recreation Therapy Notes  Date:  8.24.22 Time: 0930 Location: [address] Dayroom  Group Topic: Stress Management  Goal Area(s) Addresses:  Patient will identify positive stress management techniques. Patient will identify benefits of using stress management post d/c.  Behavioral Response: Attentive  Intervention: Stress Management  Activity :  Meditation.  LRT played a meditation that focused on being resilient in the face of adversity and managing reactions to situations beyond your control.  Education:  Stress Management, Discharge Planning.   Education Outcome: Acknowledges Education  Clinical Observations/Feedback: Despite interruptions, pt was engaged in group.  However, at the end of group, pt expressed how it was disrespectful for all the interruptions that took place during group.  Pt also expressed being appreciative of "what I did get out of it".     Caroll Rancher, LRT/CTRS         Caroll Rancher A 01/17/2021 12:16 PM

## 2021-01-17 NOTE — Progress Notes (Signed)
Adult Psychoeducational Group Note  Date:  01/17/2021 Time:  10:05 PM  Group Topic/Focus:  Wrap-Up Group: The focus of this group is to help patients review their daily goal of treatment and discuss progress on daily workbooks.  Participation Level:  Active  Participation Quality:  Appropriate and Attentive  Affect:  Appropriate  Cognitive:  Alert and Appropriate  Insight: Appropriate and Good  Engagement in Group:  Engaged and Supportive  Modes of Intervention:  Discussion  Additional Comments: The patient participated in NA Group on this date with volunteers on how to decrease dependence on relationships while beginning to meet their own needs, build confidence, and practice assertiveness. Volunteers facilitated a discussion with the group regarding triggers and identified emotional and situational factors that affect their desire to use--the end of the Wrap-Up progress note for the NA support group.       Nicoletta Dress 01/17/2021, 10:05 PM

## 2021-01-17 NOTE — [facility] Group Notes (Signed)
[facility] Group Notes:  (Nursing/MHT/Case Management/Adjunct)  Date:  01/17/2021  Time:  10:30 AM  Type of Therapy:  Pts were asked to talk about their daily and long term goals   Participation Level:  Active  Participation Quality:  Appropriate  Affect:  Excited  Cognitive:  Alert and Appropriate  Insight:  Appropriate  Engagement in Group:  Engaged  Modes of Intervention:  Discussion  Summary of Progress/Problems: Pt stated his daily goal is to stay positive and stay around supportive people, Long term is to follow thru on projects. Clydie Braun Burnie Therien 01/17/2021, 10:30 AM

## 2021-01-17 NOTE — Tx Team (Signed)
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)   Moderate alcohol use disorder (HCC)   Amphetamine abuse ([place])   Current Medications:  Current Facility-Administered Medications  Medication Dose Route Frequency Provider Last Rate Last Admin   acetaminophen (TYLENOL) tablet 650 mg  650 mg Oral Q6H PRN Rankin, Shuvon B, NP   650 mg at 01/15/21 1411   alum & mag hydroxide-simeth (MAALOX/MYLANTA) 200-200-20 MG/5ML suspension 30 mL  30 mL Oral Q4H PRN Rankin, Shuvon B, NP       feeding supplement (ENSURE ENLIVE / ENSURE PLUS) liquid 237 mL  237 mL Oral BID BM Ethelene Hal, NP   237 mL at 01/16/21 0901   hydrOXYzine (ATARAX/VISTARIL) tablet 25 mg  25 mg Oral TID PRN Rankin, Shuvon B, NP   25 mg at 01/16/21 2118   magnesium hydroxide (MILK OF MAGNESIA) suspension 30 mL  30 mL Oral Daily PRN Rankin, Shuvon B, NP       nicotine (NICODERM CQ - dosed in mg/24 hours) patch 14 mg  14 mg Transdermal Daily Ethelene Hal, NP   14 mg at 01/15/21 1657   sertraline (ZOLOFT) tablet 25 mg  25 mg Oral Daily Rankin, Shuvon B, NP   25 mg at 01/17/21 0749   traZODone (DESYREL) tablet 50 mg  50 mg Oral QHS PRN Rankin, Shuvon B, NP   50 mg at 01/16/21 2117   PTA Medications: Medications Prior to Admission  Medication Sig Dispense Refill Last Dose   amoxicillin (AMOXIL) 500 MG capsule Take 2 capsules (1,000 mg total) by mouth 2 (two) times daily. (Patient not taking: No sig reported) 40 capsule 0    cyclobenzaprine (FLEXERIL) 5 MG tablet Take 1-2 tablets (5-10 mg total) by mouth 2 (two) times daily as needed for muscle spasms. (Patient taking  differently: Take 7.5 mg by mouth 2 (two) times daily as needed for muscle spasms.) 24 tablet 0    predniSONE (STERAPRED UNI-PAK 21 TAB) 10 MG (21) TBPK tablet 6 tabs for 1 day, then 5 tabs for 1 das, then 4 tabs for 1 day, then 3 tabs for 1 day, 2 tabs for 1 day, then 1 tab for 1 day (Patient not taking: No sig reported) 21 tablet 0    sertraline (ZOLOFT) 25 MG tablet Take 2 tablets (50 mg total) by mouth daily. (Patient not taking: No sig reported) 30 tablet 0    traMADol (ULTRAM) 50 MG tablet Take 1 tablet (50 mg total) by mouth every 12 (twelve) hours as needed. (Patient not taking: Reported on 01/14/2021) 12 tablet 0     Patient Stressors: Financial difficulties Marital or family conflict Medication change or noncompliance Occupational concerns Substance abuse  Patient Strengths: Capable of independent living Warehouse manager means Religious Affiliation Supportive family/friends Work skills  Treatment Modalities: Medication Management, Group therapy, Case management,  1 to 1 session with clinician, Psychoeducation, Recreational therapy.   Physician Treatment Plan for Primary Diagnosis: Suicidal ideation Long Term Goal(s): Improvement in symptoms so as ready for discharge   Short Term Goals: Ability to identify changes in lifestyle to reduce recurrence of condition will improve Ability to verbalize feelings will improve Ability to demonstrate self-control  will improve Ability to identify and develop effective coping behaviors will improve Compliance with prescribed medications will improve Ability to identify triggers associated with substance abuse/mental health issues will improve Ability to disclose and discuss suicidal ideas  Medication Management: Evaluate patient's response, side effects, and tolerance of medication regimen.  Therapeutic Interventions: 1 to 1 sessions, Unit Group sessions and Medication administration.  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)   Moderate alcohol use disorder (HCC)   Amphetamine abuse ([place])  Long Term Goal(s): Improvement in symptoms so as ready for discharge   Short Term Goals: Ability to identify changes in lifestyle to reduce recurrence of condition will improve Ability to verbalize feelings will improve Ability to demonstrate self-control will improve Ability to identify and develop effective coping behaviors will improve Compliance with prescribed medications will improve Ability to identify triggers associated with substance abuse/mental health issues will improve Ability to disclose and discuss suicidal ideas     Medication Management: Evaluate patient's response, side effects, and tolerance of medication regimen.  Therapeutic Interventions: 1 to 1 sessions, Unit Group sessions and Medication administration.  Evaluation of Outcomes: Not Met   RN Treatment Plan for Primary Diagnosis: Suicidal ideation Long Term Goal(s): Knowledge of disease and therapeutic regimen to maintain health will improve  Short Term Goals: Ability to remain free from injury will improve, Ability to participate in decision making will improve, Ability to verbalize feelings will improve, Ability to disclose and discuss suicidal ideas, and Ability to identify and develop effective coping behaviors will improve  Medication Management: RN will administer medications as ordered by provider, will assess and evaluate patient's response and provide education to patient for prescribed medication. RN will report any adverse and/or side effects to prescribing provider.  Therapeutic Interventions: 1 on 1 counseling sessions, Psychoeducation, Medication administration,  Evaluate responses to treatment, Monitor vital signs and CBGs as ordered, Perform/monitor CIWA, COWS, AIMS and Fall Risk screenings as ordered, Perform wound care treatments as ordered.  Evaluation of Outcomes: Not Met   LCSW Treatment Plan for Primary Diagnosis: Suicidal ideation Long Term Goal(s): Safe transition to appropriate next level of care at discharge, Engage patient in therapeutic group addressing interpersonal concerns.  Short Term Goals: Engage patient in aftercare planning with referrals and resources, Increase social support, Increase emotional regulation, Facilitate acceptance of mental health diagnosis and concerns, Identify triggers associated with mental health/substance abuse issues, and Increase skills for wellness and recovery  Therapeutic Interventions: Assess for all discharge needs, 1 to 1 time with Social worker, Explore available resources and support systems, Assess for adequacy in community support network, Educate family and significant other(s) on suicide prevention, Complete Psychosocial Assessment, Interpersonal group therapy.  Evaluation of Outcomes: Not Met   Progress in Treatment: Attending groups: Yes. Participating in groups: Yes. Taking medication as prescribed: Yes. Toleration medication: Yes. Family/Significant other contact made: Yes, individual(s) contacted:  If consents are provided  Patient understands diagnosis: Yes. and No. Discussing patient identified problems/goals with staff: Yes. Medical problems stabilized or resolved: Yes. Denies suicidal/homicidal ideation: Yes. Issues/concerns per patient self-inventory: No.   New problem(s) identified: No, Describe:  None   New Short Term/Long Term Goal(s): medication stabilization, elimination of SI thoughts, development of comprehensive mental wellness plan.   Patient Goals: "To stay  positive and stay on my medications"   Discharge Plan or Barriers: Patient recently admitted. CSW will  continue to follow and assess for appropriate referrals and possible discharge planning.   Reason for Continuation of Hospitalization: Depression Medication stabilization Suicidal ideation  Estimated Length of Stay: 3 to 5 days   Attendees: Patient: Ronnie Hoover  01/17/2021   Physician: Lestine Mount, DO 01/17/2021   Nursing:  01/17/2021   RN Care Manager: 01/17/2021   Social Worker: Verdis Frederickson, [place] 01/17/2021   Recreational Therapist:  01/17/2021  Other:  01/17/2021   Other:  01/17/2021   Other: 01/17/2021     Scribe for Treatment Team: Darleen Crocker, [place] 01/17/2021 1:30 PM

## 2021-01-17 NOTE — Progress Notes (Signed)
  D: Patient is alert and oriented x 4. Patient denies SI/HI/ AVH and pain. Disposition is Calm and cooperative with appropriate affect. Verbally contracts for safety to this Clinical research associate.   A:  Pt was given scheduled medications. Pt was encourage to attend groups. Q 15 minute checks were done for safety. Pt was visualized interacting appropriately with others in the milieu. Pt was offered support and encouragement by this Clinical research associate. Pt is goal oriented and stated goal was reached for the day. Pt attended groups and interacts well with peers and staff. Pt complied with scheduled medications. No signs of distress nor concerns reported by patient at present. Pt remains receptive to treatment and safety maintained on unit.   R: Will continue to monitor and assess. Safety maintained during this shift.      01/17/21 2346  Psych Admission Type (Psych Patients Only)  Admission Status Voluntary  Psychosocial Assessment  Patient Complaints None  Eye Contact Brief  Facial Expression Anxious  Affect Anxious  Speech Logical/coherent  Interaction Assertive  Appearance/Hygiene Unremarkable  Behavior Characteristics Cooperative  Mood Pleasant  Thought Process  Coherency WDL  Content WDL  Delusions None reported or observed  Perception WDL  Hallucination None reported or observed  Judgment WDL  Confusion None  Danger to Self  Current suicidal ideation? Denies  Danger to Others  Danger to Others None reported or observed

## 2021-01-17 NOTE — Progress Notes (Signed)
Pt has been alert and oriented to person, place, time and situation. Pt is calm, cooperative, denies SI/HI/AVH. Pt spends time watching tv, talking with peers often in the dayroom, and talking on the phone. Pt reports he feels like his "mind is right" and that he has "people out there that will support me" and reports he hopes to discharge soon, and hopes that one day he will be able to see his children again. Will continue to monitor pt per Q15 minute face checks and monitor for safety and progress.

## 2021-01-18 DIAGNOSIS — R45851 Suicidal ideations: Secondary | ICD-10-CM | POA: Diagnosis not present

## 2021-01-18 MED ORDER — SERTRALINE HCL 25 MG PO TABS: 25.0000 mg | ORAL_TABLET | Freq: Every day | ORAL | 0 refills | Status: DC

## 2021-01-18 MED ORDER — HYDROXYZINE HCL 25 MG PO TABS: 25.0000 mg | ORAL_TABLET | Freq: Three times a day (TID) | ORAL | 0 refills | Status: DC | PRN

## 2021-01-18 MED ORDER — TRAZODONE HCL 50 MG PO TABS: 50.0000 mg | ORAL_TABLET | Freq: Every evening | ORAL | 0 refills | Status: DC | PRN

## 2021-01-18 MED ORDER — NICOTINE 14 MG/24HR TD PT24: 14.0000 mg | MEDICATED_PATCH | Freq: Every day | TRANSDERMAL | 0 refills | Status: DC

## 2021-01-18 NOTE — [facility] Suicide Risk Assessment (Signed)
[facility] INPATIENT:  Family/Significant Other Suicide Prevention Education  Suicide Prevention Education:  Education Completed;  Ricki Rodriguez, Sister, [phone],  (name of family member/significant other) has been identified by the patient as the family member/significant other with whom the patient will be residing, and identified as the person(s) who will aid the patient in the event of a mental health crisis (suicidal ideations/suicide attempt).  With written consent from the patient, the family member/significant other has been provided the following suicide prevention education, prior to the and/or following the discharge of the patient.  The suicide prevention education provided includes the following: Suicide risk factors Suicide prevention and interventions National Suicide Hotline telephone number [facility] assessment telephone number [facility] Emergency Assistance 911 [facility] and/or Residential Mobile Crisis Unit telephone number  Request made of family/significant other to: Remove weapons (e.g., guns, rifles, knives), all items previously/currently identified as safety concern.   Remove drugs/medications (over-the-counter, prescriptions, illicit drugs), all items previously/currently identified as a safety concern.  The family member/significant other verbalizes understanding of the suicide prevention education information provided.  The family member/significant other agrees to remove the items of safety concern listed above.  "Wanting to see his kids led him to the hospital. He will come live with me until dad gets home." CSW discussed f/u.  -No weapons in the home -Medications secured -No safety concerns  Felizardo Hoffmann 01/18/2021, 9:59 AM

## 2021-01-18 NOTE — Progress Notes (Signed)
   01/18/21 0614  Vital Signs  Pulse Rate 76  Pulse Rate Source Monitor  Resp 18  BP 98/87  BP Location Right Arm  BP Method Automatic  Patient Position (if appropriate) Standing  Oxygen Therapy  SpO2 97 %  O2 Device Room Air  Sleep  Number of Hours 6.75   D: Patient denies SI/HI/AVH, anxiety and depression.  A:  Patient took scheduled medicine.  Support and encouragement provided Routine safety checks conducted every 15 minutes. Patient  Informed to notify staff with any concerns.   R:  Safety maintained.

## 2021-01-18 NOTE — [facility] Group Notes (Signed)
Adult Psychoeducational Group Note  Date:  01/18/2021 Time:  9:48 AM  Group Topic/Focus:  Goals Group:   The focus of this group is to help patients establish daily goals to achieve during treatment and discuss how the patient can incorporate goal setting into their daily lives to aide in recovery.  Participation Level:  Active  Participation Quality:  Appropriate  Affect:  Appropriate  Cognitive:  Appropriate  Insight: Appropriate  Engagement in Group:  Engaged  Modes of Intervention:  Orientation  Additional Comments: Goal is to talk with doctor about discharge  Azalee Course 01/18/2021, 9:48 AM

## 2021-01-18 NOTE — Discharge Summary (Signed)
Physician Discharge Summary Note  Patient:  Ronnie Hoover is an 35 y.o., male MRN:  [id number] DOB:  April 14, 1986 Patient phone:  [phone] (home)  Patient address:   [address] [place] [address],  Total Time spent with patient: 45 minutes  Date of Admission:  01/15/2021 Date of Discharge: 01/18/2021  Reason for Admission:  Per H&P- "Ronnie Hoover is a 35 y.o. male with PPHx of reported depression and no noted PMHx who was admitted voluntarily to the [facility] after a suicide attempt.    Per brief chart review, patient was found by his sister after ingestion of 2.5 pills of Flexeril and brought to ED via EMS. He reported that he initially separated from his  long term girlfriend in November 2021 but they had gotten back together to work things out for the kids. However, 1-2 weeks ago they separated again and he has not been allowed to see the children, leading him to feel hopeless and attempt suicide via Flexeril ingestion. He was medically cleared in ED though did report some atypical right-sided chest pain that was reproducible on palpation.    On interview, patient reports that his primary stressor and triggering factor for his suicide attempt was this breakup. He states that he was with his girlfriend for 8 years, and though he is not a biological parent, he loves and cares for their children. He reports that he felt considerable confusion and frustration, and took the pills to "sleep it away." He states that he did take the medication with intention to die.    Patient does report that around 1 year ago, he presented to [facility] for suicidal ideation. States that at that time he was admitted and stabilized with psychotherapy and Zoloft. States that he took the Zoloft until January 2022, when he ran out of his prescription. He states that he did not know if he'd be able to get a refill, and did not know how to do so. He also got back together with longterm girlfriend around this time so felt  he did not need the medication. States that between January 2022 and a few weeks ago, he did not have significant symptoms of depression. States that he did have some intermittent feelings of guilt and worthlessness but these worsened acutely since his breakup 1-2 weeks ago. He denies any declined interest in enjoyable activities or changes in concentration, appetite, or sleep over the past few months. He states that overall, he has very good social support and a fulfilling job (see social history).    When asked about auditory hallucinations, patient does report that when he is alone, he sometimes hears a voice asking "why are you here," "just go," etc. He states that it sounds like another person's voice. He reports that it only occurs when he is completely alone and did happen recently, but he is usually able to cope with it by listening to music, writing music, or engaging with others. States that over the past few months, it did not happen frequently since he was living with his girlfriend and kids. He denies VH, current SI, or any HI.    Regarding symptoms of anxiety, he states that he sometimes has excessive worry, but is typically able to talk himself out of said worry. He denies any history of trauma. He denies any symptoms of PTSD including nightmares, flashbacks, or hypervigilance.    Regarding substance use, patient endorses use of multiple substances. First cannabis use at age 79 with a period  of abstinence until age 15, but 2-3x daily use since age 89. He states he uses cannabis to decrease anxiety, thought-spiraling, and to prevent the feeling of aloneness which triggers possible auditory hallucinations. First tobacco use reported at age 64, with intermittent periods of abstinence of 2-3 weeks. Patient states he used to smoke to relieve stress, and now smokes primarily socially. He denies any current nicotine cravings or withdrawal symptoms. Patient reports first alcohol use at 86. States that  he primarily drinks ~2 beers per day and only drinks liquor on special occasions. He denies ever feeling a need to have alcohol to make it through the day or feeling like he is drinking too much, but states that his mother and his ex-girlfriend have made such comments to him. He states that his ex-girlfriend last made such a comment to him 2 weeks ago due to him drinking a 12-pack in 2 days. He states that he doesn't usually have the money to drink that much, but when he does, he drinks because he enjoys beer. He states he does not intentionally drink to get intoxicated. He denies any additional substance use including any IV or nasally inhaled substances. Denies any methamphetamine use.    Per remote chart review, aforementioned psychiatric admission was likely in 04/2020. At that time patient was held in ED for 2 days and evaluated by psychiatry. He was prescribed 30 days of Zoloft  and d/ced to PHP/IOP, with sister as support. Likely ran out of Zoloft after just 1 month of treatment. At that time, he had been reporting depressed mood for several months. 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)   Moderate alcohol use disorder (HCC)   Amphetamine abuse (HCC)   Past Psychiatric History: MDD,   Past Medical History:  Past Medical History:  Diagnosis Date   Abscess    Bronchitis    Low back pain with bilateral sciatica, unspecified back pain laterality, unspecified chronicity     Past Surgical History:  Procedure Laterality Date   IRRIGATION AND DEBRIDEMENT ABSCESS Right 03/20/2017   Procedure: IRRIGATION AND DEBRIDEMENT ABSCESS , EUA, DRAIN PLACEMENT;  Surgeon: Andria Meuse, MD;  Location: [facility] OR;  Service: General;  Laterality: Right;    Family History:  Family History  Family history unknown: Yes   Family Psychiatric  History: Reports none Social History:  Social History   Substance and Sexual Activity  Alcohol Use Yes   Alcohol/week: 14.0 standard drinks   Types: 12 Cans of beer, 2 Shots of liquor per week   Comment: Per pt he drinks vodka occassionally & drinks 12 pkt beer Q 2 days"     Social History   Substance and Sexual Activity  Drug Use Yes   Types: Marijuana    Social History   Socioeconomic History   Marital status: Single    Spouse name: Not on file   Number of children: Not on file   Years of education: Not on file   Highest education level: Not on file  Occupational History   Not on file  Tobacco Use   Smoking status: Every Day    Packs/day: 1.00    Types: Cigarettes   Smokeless tobacco: Never  Vaping Use   Vaping Use: Never used  Substance and Sexual Activity  Alcohol use: Yes    Alcohol/week: 14.0 standard drinks    Types: 12 Cans of beer, 2 Shots of liquor per week    Comment: Per pt he drinks vodka occassionally & drinks 12 pkt beer Q 2 days"   Drug use: Yes    Types: Marijuana   Sexual activity: Not on file  Other Topics Concern   Not on file  Social History Narrative   Not on file   Social Determinants of Health   Financial Resource Strain: Not on file  Food Insecurity: Not on file  Transportation Needs: Not on file  Physical Activity: Not on file  Stress: Not on file  Social Connections: Not on file    Hospital Course:  Patient presented to [facility] on 8/21 for an Overdose of a muscle relaxer after breaking up with his girlfriend.  He reported taking 2.5 of the tablets.  He was admitted to [facility] on 8/23.  He was restarted on Zoloft as this had worked for him in the past.  Since he reported stopping his medication last time he had SI was because he did not know how to get outpatient appointments made sure that before discharge this time he had adequate resources lined up  outpatient.  Also made sure that he had a strong support network which he reported was his family.  On day of discharge she reported no SI, HI, or AVH.  He was discharged home to his sister.   On day of discharge he reported that he was doing well.  He reported that he slept well last night.  He reported that his appetite has been doing good.  He reported no SI, HI, or AVH.  Discussed with him that his follow-up appointments would be on his discharge paperwork so that he would know where to go so that he can continue on his medication.  Discussed with him the importance of continuing his medication and not stopping the until instructed to do so by his outpatient provider.  He reported understanding.  Talked about his plans for discharge he reported that he finds a lot of satisfaction and work and was wanting to get back to work.  He reports that he works full-time at [employer] and then part-time in [employer] at [employer]  When asked if this might be too much work he reported that it was not overwhelming and he really enjoyed it kept him busy and restarting his coping skills.  He also reported that his family would be a big support for him.  He reported he would be living with his sister on discharge until his father came back from out of town at which point he would go to live with him.  He reported that he was excited and looking forward to it.  He had no other concerns.  He was discharged home with his sister.  Physical Findings:   Musculoskeletal: Strength & Muscle Tone: within normal limits Gait & Station: normal Patient leans: N/A   Psychiatric Specialty Exam:  Presentation  General Appearance: Casual; Appropriate for Environment; Fairly Groomed  Eye Contact:Good  Speech:Clear and Coherent; Normal Rate  Speech Volume:Normal  Handedness: No data recorded  Mood and Affect  Mood:Euthymic; Anxious  Affect:Congruent   Thought Process  Thought Processes:Coherent; Goal  Directed; Linear  Descriptions of Associations:Intact  Orientation:Full (Time, Place and Person)  Thought Content:Logical  History of Schizophrenia/Schizoaffective disorder:No  Duration of Psychotic Symptoms:Greater than six months (Pt reports voices come and go throughout  life)  Hallucinations:No data recorded Ideas of Reference:None  Suicidal Thoughts:No data recorded Homicidal Thoughts:No data recorded  Sensorium  Memory:Immediate Good; Recent Fair; Remote Fair  Judgment:Fair  Insight:Good   Executive Functions  Concentration:Good  Attention Span:Good  Recall:Fair  Fund of Knowledge:Fair  Language:Good   Psychomotor Activity  Psychomotor Activity: No data recorded  Assets  Assets:Communication Skills; Desire for Improvement; Housing; Social Support; Vocational/Educational   Sleep  Sleep: No data recorded   Physical Exam: Physical Exam Vitals and nursing note reviewed.  Constitutional:      General: He is not in acute distress.    Appearance: Normal appearance. He is normal weight. He is not ill-appearing or toxic-appearing.  HENT:     Head: Normocephalic and atraumatic.  Pulmonary:     Effort: Pulmonary effort is normal.  Musculoskeletal:        General: Normal range of motion.  Neurological:     General: No focal deficit present.     Mental Status: He is alert.   Review of Systems  Respiratory:  Negative for cough and shortness of breath.   Cardiovascular:  Negative for chest pain.  Gastrointestinal:  Negative for abdominal pain, constipation, diarrhea, nausea and vomiting.  Neurological:  Negative for weakness and headaches.  Psychiatric/Behavioral:  Negative for depression, hallucinations and suicidal ideas. The patient is not nervous/anxious.   Blood pressure 98/87, pulse 76, temperature 98.1 F (36.7 C), temperature source Oral, resp. rate 18, height 6\' 1"  (1.854 m), weight 63.5 kg, SpO2 97 %. Body mass index is 18.47  kg/m.   Social History   Tobacco Use  Smoking Status Every Day   Packs/day: 1.00   Types: Cigarettes  Smokeless Tobacco Never   Tobacco Cessation:  A prescription for an FDA-approved tobacco cessation medication provided at discharge   Blood Alcohol level:  Lab Results  Component Value Date   [facility] 01/14/2021   ETH <10 05/23/2020    Metabolic Disorder Labs:  Lab Results  Component Value Date   HGBA1C 5.6 01/16/2021   MPG 114.02 01/16/2021   No results found for: PROLACTIN Lab Results  Component Value Date   CHOL 199 01/16/2021   TRIG 103 01/16/2021   HDL 35 (L) 01/16/2021   CHOLHDL 5.7 01/16/2021   VLDL 21 01/16/2021   LDLCALC 143 (H) 01/16/2021    See Psychiatric Specialty Exam and Suicide Risk Assessment completed by Attending Physician prior to discharge.  Discharge destination:  Home  Is patient on multiple antipsychotic therapies at discharge:  No   Has Patient had three or more failed trials of antipsychotic monotherapy by history:  No  Recommended Plan for Multiple Antipsychotic Therapies: NA   Prescriptions given at discharge. Patient agreeable to plan. Given opportunity to ask questions. Appears to feel comfortable with discharge denies any current suicidal or homicidal thought.  Patient is also instructed prior to discharge to: Take all medications as prescribed by mental healthcare provider. Report any adverse effects and or reactions from the medicines to outpatient provider promptly. Patient has been instructed & cautioned: To not engage in alcohol and or illegal drug use while on prescription medicines. In the event of worsening symptoms,  patient is instructed to call the crisis hotline, 911 and or go to the nearest ED for appropriate evaluation and treatment of symptoms. To follow-up with primary care provider for other medical issues, concerns and or health care needs  The patient was evaluated each day by a clinical provider to ascertain  response to treatment.  Improvement was noted by the patient's report of decreasing symptoms, improved sleep and appetite, affect, medication tolerance, behavior, and participation in unit programming.  Patient was asked each day to complete a self inventory noting mood, mental status, pain, new symptoms, anxiety and concerns.  Patient responded well to medication and being in a therapeutic and supportive environment. Positive and appropriate behavior was noted and the patient was motivated for recovery. The patient worked closely with the treatment team and case manager to develop a discharge plan with appropriate goals. Coping skills, problem solving as well as relaxation therapies were also part of the unit programming.  By the day of discharge patient was in much improved condition than upon admission.  Symptoms were reported as significantly decreased or resolved completely. The patient denied SI/HI and voiced no AVH. The patient was motivated to continue taking medication with a goal of continued improvement in mental health.   Patient was discharged home with a plan to follow up as noted below.    Allergies as of 01/18/2021       Reactions   Banana Anaphylaxis, Nausea And Vomiting   Tomato Anaphylaxis, Hives        Medication List     STOP taking these medications    amoxicillin 500 MG capsule Commonly known as: AMOXIL   cyclobenzaprine 5 MG tablet Commonly known as: FLEXERIL   predniSONE 10 MG (21) Tbpk tablet Commonly known as: STERAPRED UNI-PAK 21 TAB   traMADol 50 MG tablet Commonly known as: ULTRAM       TAKE these medications      Indication  hydrOXYzine 25 MG tablet Commonly known as: ATARAX/VISTARIL Take 1 tablet (25 mg total) by mouth 3 (three) times daily as needed for anxiety.  Indication: Feeling Anxious   nicotine 14 mg/24hr patch Commonly known as: NICODERM CQ - dosed in mg/24 hours Place 1 patch (14 mg total) onto the skin daily. (May buy from over  the counter): For smoking cessation Start taking on: January 19, 2021  Indication: Nicotine Addiction   sertraline 25 MG tablet Commonly known as: ZOLOFT Take 1 tablet (25 mg total) by mouth daily. For depression Start taking on: January 19, 2021 What changed:  how much to take additional instructions  Indication: Major Depressive Disorder   traZODone 50 MG tablet Commonly known as: DESYREL Take 1 tablet (50 mg total) by mouth at bedtime as needed for sleep.  Indication: Trouble Sleeping        Follow-up Information     [facility]. Go to.   Specialty: Behavioral Health Why: Please go to this provider, located on the second floor,  for therapy and medication management services during walk in hours:  Monday through Wednesday from 7:45 am to 11:00 am.  Services are provided on a first come, first served basis. Contact information: [address] [place] [address] [phone]        [facility] Follow up.   Why: Call or go to this facility to learn about various services offered such as peer support and group therapy. Contact information: [address] [place], [place] [address] [phone]                Follow-up recommendations:   - Activity as tolerated. - Diet as recommended by PCP. - Keep all scheduled follow-up appointments as recommended.  Comments:  Patient is instructed to take all prescribed medications as recommended. Report any side effects or adverse reactions  to your outpatient psychiatrist. Patient is instructed to abstain from alcohol and illegal drugs while on prescription medications. In the event of worsening symptoms, patient is instructed to call the crisis hotline, 911, or go to the nearest emergency department for evaluation and treatment.  Signed: Lauro Franklin, MD 01/18/2021, 4:09 PM

## 2021-01-18 NOTE — Final Progress Note (Signed)
Discharge Note:  Patient denies SI/HI AVH at this time. Discharge instructions, AVS, prescriptions and transition record gone over with patient. Patient agrees to comply with medication management, follow-up visit, and outpatient therapy. Patient belongings returned to patient. Patient questions and concerns addressed and answered.  Patient ambulatory off unit.  Patient discharged to home with friend.   

## 2021-01-18 NOTE — Progress Notes (Signed)
  [facility] Adult Case Management Discharge Plan :  Will you be returning to the same living situation after discharge:  No. Sister's home At discharge, do you have transportation home?: Yes,  sister Do you have the ability to pay for your medications: Yes,  income from employment  Release of information consent forms completed and in the chart;  Patient's signature needed at discharge.  Patient to Follow up at:  Follow-up Information     [facility]. Go to.   Specialty: Behavioral Health Why: Please go to this provider, located on the second floor,  for therapy and medication management services during walk in hours:  Monday through Wednesday from 7:45 am to 11:00 am.  Services are provided on a first come, first served basis. Contact information: [address] [place] [address] [phone]        [facility] Follow up.   Why: Call or go to this facility to learn about various services offered such as peer support and group therapy. Contact information: [address] [place], [place], [place] [address] [phone]                Next level of care provider has access to [facility] Link:yes  Safety Planning and Suicide Prevention discussed: Yes,  sister     Has patient been referred to the Quitline?: Patient refused referral  Patient has been referred for addiction treatment: Pt. refused referral  Felizardo Hoffmann, Theresia Majors 01/18/2021, 10:06 AM

## 2021-01-18 NOTE — BHH Suicide Risk Assessment (Signed)
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  Patient reports that the kids resided with him for many years, and now that he and the girlfriend are not together, she will not let him visit them.  Patient states that he is worked on his Pharmacologist, as that his family is supportive, plans to live with his sister till his dad returns back.  Patient has that he enjoys his work, feels that he is not struggling with depression, is future oriented.  Patient denies any side effects with his medications, any thoughts of self-harm or harm to others Total Time spent with patient: 30 minutes  Musculoskeletal: Strength & Muscle Tone: within normal limits Gait & Station: normal Patient leans: N/A  Psychiatric Specialty Exam  Presentation  General Appearance: Casual; Appropriate for Environment; Fairly Groomed  Eye Contact:Good  Speech:Clear and Coherent; Normal Rate  Speech Volume:Normal  Handedness: No data recorded  Mood and Affect  Mood:Euthymic; Anxious  Duration of Depression Symptoms: Greater than two weeks  Affect:Congruent   Thought Process  Thought Processes:Coherent; Goal Directed; Linear  Descriptions of Associations:Intact  Orientation:Full (Time, Place and Person)  Thought Content:Logical  History of Schizophrenia/Schizoaffective disorder:No  Duration of Psychotic Symptoms:Greater than six months (Pt reports voices come and go throughout life)  Hallucinations:No data recorded Ideas of  Reference:None  Suicidal Thoughts:No data recorded Homicidal Thoughts:No data recorded  Sensorium  Memory:Immediate Good; Recent Fair; Remote Fair  Judgment:Fair  Insight:Good   Executive Functions  Concentration:Good  Attention Span:Good  Recall:Fair  Fund of Knowledge:Fair  Language:Good   Psychomotor Activity  Psychomotor Activity: No data recorded  Assets  Assets:Communication Skills; Desire for Improvement; Housing; Social Support; Vocational/Educational   Sleep  Sleep: No data recorded  Physical Exam: Physical Exam Review of Systems  Constitutional: Negative.  Negative for fever and malaise/fatigue.  HENT: Negative.  Negative for congestion, sinus pain and sore throat.   Eyes: Negative.  Negative for blurred vision, double vision, discharge and redness.  Respiratory: Negative.  Negative for cough, shortness of breath and wheezing.   Cardiovascular: Negative.  Negative for chest pain and palpitations.  Gastrointestinal: Negative.  Negative for abdominal pain, constipation, heartburn, nausea and vomiting.  Musculoskeletal: Negative.  Negative for falls, joint pain and myalgias.  Skin: Negative.  Negative for rash.  Neurological: Negative.  Negative for dizziness, seizures and loss of consciousness.  Endo/Heme/Allergies: Negative.  Negative for environmental allergies.  Psychiatric/Behavioral: Negative.  Negative for depression, hallucinations, memory loss, substance abuse and suicidal ideas. The patient is not nervous/anxious and does not have insomnia.   Blood pressure 98/87, pulse 76, temperature 98.1 F (36.7 C), temperature source Oral, resp. rate 18, height 6\' 1"  (1.854 m), weight 63.5 kg, SpO2 97 %. Body mass index is 18.47 kg/m.  Mental Status Per Nursing Assessment::   On Admission:  Suicidal ideation indicated by patient, Self-harm thoughts, Self-harm behaviors  Demographic Factors:  Male and Low socioeconomic status  Loss Factors: Loss of  significant relationship  Historical Factors: Prior suicide attempts  Risk Reduction Factors:   Living with another person,  especially a relative and Positive social support  Continued Clinical Symptoms:  Previous Psychiatric Diagnoses and Treatments  Cognitive Features That Contribute To Risk:  None    Suicide Risk:  Minimal: No identifiable suicidal ideation.  Patients presenting with no risk factors but with morbid ruminations; may be classified as minimal risk based on the severity of the depressive symptoms   Follow-up Information     [facility]. Go to.   Specialty: Behavioral Health Why: Please go to this provider, located on the second floor,  for therapy and medication management services during walk in hours:  Monday through Wednesday from 7:45 am to 11:00 am.  Services are provided on a first come, first served basis. Contact information: [address] [place] [address] [phone]        [facility] Follow up.   Why: Call or go to this facility to learn about various services offered such as peer support and group therapy. Contact information: [address] [place], [place] [address] [phone]               Allergies as of 01/18/2021       Reactions   Banana Anaphylaxis, Nausea And Vomiting   Tomato Anaphylaxis, Hives        Medication List     STOP taking these medications    amoxicillin 500 MG capsule Commonly known as: AMOXIL   cyclobenzaprine 5 MG tablet Commonly known as: FLEXERIL   predniSONE 10 MG (21) Tbpk tablet Commonly known as: STERAPRED UNI-PAK 21 TAB   traMADol 50 MG tablet Commonly known as: ULTRAM       TAKE these medications    hydrOXYzine 25 MG tablet Commonly known as: ATARAX/VISTARIL Take 1 tablet (25 mg total) by mouth 3 (three) times daily as needed for anxiety.   nicotine 14 mg/24hr patch Commonly known as: NICODERM CQ - dosed in mg/24  hours Place 1 patch (14 mg total) onto the skin daily. (May buy from over the counter): For smoking cessation Start taking on: January 19, 2021   sertraline 25 MG tablet Commonly known as: ZOLOFT Take 1 tablet (25 mg total) by mouth daily. For depression Start taking on: January 19, 2021 What changed:  how much to take additional instructions   traZODone 50 MG tablet Commonly known as: DESYREL Take 1 tablet (50 mg total) by mouth at bedtime as needed for sleep.        Plan Of Care/Follow-up activity as tolerated, heart healthy diet and to keep follow-up appointments and take medications as prescribed   Nelly Rout, MD 01/18/2021, 11:24 AM

## 2021-07-11 ENCOUNTER — Encounter (HOSPITAL_COMMUNITY): Payer: Self-pay | Admitting: Emergency Medicine

## 2021-07-11 ENCOUNTER — Other Ambulatory Visit: Payer: Self-pay

## 2021-07-11 ENCOUNTER — Ambulatory Visit (HOSPITAL_COMMUNITY)
Admission: EM | Admit: 2021-07-11 | Discharge: 2021-07-11 | Disposition: A | Discharge status: Home / Self Care | Payer: Medicaid Other | Attending: Family Medicine | Admitting: Family Medicine

## 2021-07-11 DIAGNOSIS — J069 Acute upper respiratory infection, unspecified: Secondary | ICD-10-CM

## 2021-07-11 DIAGNOSIS — U071 COVID-19: Secondary | ICD-10-CM | POA: Insufficient documentation

## 2021-07-11 DIAGNOSIS — R051 Acute cough: Secondary | ICD-10-CM | POA: Insufficient documentation

## 2021-07-11 LAB — SARS CORONAVIRUS 2 (TAT 6-24 HRS): SARS Coronavirus 2: POSITIVE — AB

## 2021-07-11 MED ORDER — PROMETHAZINE-DM 6.25-15 MG/5ML PO SYRP: 5.0000 mL | ORAL_SOLUTION | Freq: Four times a day (QID) | ORAL | 0 refills | Status: DC | PRN

## 2021-07-11 NOTE — Discharge Instructions (Signed)
You have been tested for COVID-19 today. °If your test returns positive, you will receive a phone call from [facility] regarding your results. °Negative test results are not called. °Both positive and negative results area always visible on MyChart. °If you do not have a MyChart account, sign up instructions are provided in your discharge papers. °Please do not hesitate to contact us should you have questions or concerns. ° °

## 2021-07-11 NOTE — ED Triage Notes (Signed)
Since yesterday morning having cough, congestion, sore throat, chills, states that fiance stated he had intermittent fevers yesterday.

## 2021-07-11 NOTE — ED Provider Notes (Signed)
[facility]   [id number] 07/11/21 Arrival Time: [id number]  ASSESSMENT & PLAN:  1. Viral URI with cough    Discussed typical duration of viral illnesses. Viral testing send (COVID). OTC symptom care as needed.  Begin: New Prescriptions   PROMETHAZINE-DEXTROMETHORPHAN (PROMETHAZINE-DM) 6.25-15 MG/5ML SYRUP    Take 5 mLs by mouth 4 (four) times daily as needed for cough.   Work note provided.   Follow-up Information      [facility]gent Care at [facility]   Specialty: Urgent Care Why: As needed. Contact information: [place] SSN-[id number] [phone]                Reviewed expectations re: course of current medical issues. Questions answered. Outlined signs and symptoms indicating need for more acute intervention. Understanding verbalized. After Visit Summary given.   SUBJECTIVE: History from: Patient. Ronnie Hoover is a 36 y.o. male. Reports: cough; congestion, chills, body aches; abrupt onset; x 24 hours. Denies: difficulty breathing. Normal PO intake without n/v/d.  OBJECTIVE:  Vitals:   07/11/21 1032  BP: 126/86  Pulse: 84  Resp: 15  Temp: 98.8 F (37.1 C)  TempSrc: Oral  SpO2: 99%    General appearance: alert; no distress Eyes: PERRLA; EOMI; conjunctiva normal HENT: [place]; AT; with nasal congestion Neck: supple  Lungs: speaks full sentences without difficulty; unlabored; clear Extremities: no edema Skin: warm and dry Neurologic: normal gait Psychological: alert and cooperative; normal mood and affect  Labs:  Labs Reviewed  SARS CORONAVIRUS 2 (TAT 6-24 HRS)    Allergies  Allergen Reactions   Banana Anaphylaxis and Nausea And Vomiting   Tomato Anaphylaxis and Hives    Past Medical History:  Diagnosis Date   Abscess    Bronchitis    Low back pain with bilateral sciatica, unspecified back pain laterality, unspecified chronicity    Social History   Socioeconomic History   Marital status:  Single    Spouse name: Not on file   Number of children: Not on file   Years of education: Not on file   Highest education level: Not on file  Occupational History   Not on file  Tobacco Use   Smoking status: Every Day    Packs/day: 1.00    Types: Cigarettes   Smokeless tobacco: Never  Vaping Use   Vaping Use: Never used  Substance and Sexual Activity   Alcohol use: Yes    Alcohol/week: 14.0 standard drinks    Types: 12 Cans of beer, 2 Shots of liquor per week    Comment: Per pt he drinks vodka occassionally & drinks 12 pkt beer Q 2 days"   Drug use: Yes    Types: Marijuana   Sexual activity: Not on file  Other Topics Concern   Not on file  Social History Narrative   Not on file   Social Determinants of Health   Financial Resource Strain: Not on file  Food Insecurity: Not on file  Transportation Needs: Not on file  Physical Activity: Not on file  Stress: Not on file  Social Connections: Not on file  Intimate Partner Violence: Not on file   Family History  Family history unknown: Yes   Past Surgical History:  Procedure Laterality Date   IRRIGATION AND DEBRIDEMENT ABSCESS Right 03/20/2017   Procedure: IRRIGATION AND DEBRIDEMENT ABSCESS , EUA, DRAIN PLACEMENT;  Surgeon: Ileana Roup, MD;  Location: [facility];  Service: General;  Laterality: Right;     Vanessa Kick, MD  07/11/21 1054 ° °

## 2021-11-09 ENCOUNTER — Ambulatory Visit (HOSPITAL_COMMUNITY)
Admission: EM | Admit: 2021-11-09 | Discharge: 2021-11-09 | Disposition: A | Discharge status: Home / Self Care | Payer: Medicaid Other | Attending: Student | Admitting: Student

## 2021-11-09 ENCOUNTER — Encounter (HOSPITAL_COMMUNITY): Payer: Self-pay | Admitting: Emergency Medicine

## 2021-11-09 DIAGNOSIS — R112 Nausea with vomiting, unspecified: Secondary | ICD-10-CM

## 2021-11-09 DIAGNOSIS — R197 Diarrhea, unspecified: Secondary | ICD-10-CM

## 2021-11-09 LAB — COMPREHENSIVE METABOLIC PANEL
ALT: 12 U/L (ref 0–44)
AST: 20 U/L (ref 15–41)
Albumin: 4.3 g/dL (ref 3.5–5.0)
Alkaline Phosphatase: 52 U/L (ref 38–126)
Anion gap: 10 (ref 5–15)
BUN: 6 mg/dL (ref 6–20)
CO2: 25 mmol/L (ref 22–32)
Calcium: 9.6 mg/dL (ref 8.9–10.3)
Chloride: 104 mmol/L (ref 98–111)
Creatinine, Ser: 1.15 mg/dL (ref 0.61–1.24)
GFR, Estimated: >60 mL/min (ref >60)
Glucose, Bld: 85 mg/dL (ref 70–99)
Potassium: 4 mmol/L (ref 3.5–5.1)
Sodium: 139 mmol/L (ref 135–145)
Total Bilirubin: 1.6 mg/dL — ABNORMAL HIGH (ref 0.3–1.2)
Total Protein: 7.8 g/dL (ref 6.5–8.1)

## 2021-11-09 LAB — CBC
HCT: 48.4 % (ref 39.0–52.0)
Hemoglobin: 16.6 g/dL (ref 13.0–17.0)
MCH: 33.7 pg (ref 26.0–34.0)
MCHC: 34.3 g/dL (ref 30.0–36.0)
MCV: 98.4 fL (ref 80.0–100.0)
Platelets: 262 10*3/uL (ref 150–400)
RBC: 4.92 MIL/uL (ref 4.22–5.81)
RDW: 12.7 % (ref 11.5–15.5)
WBC: 6.4 10*3/uL (ref 4.0–10.5)
nRBC: 0 % (ref 0.0–0.2)

## 2021-11-09 LAB — LIPASE, BLOOD: Lipase: 27 U/L (ref 11–51)

## 2021-11-09 MED ORDER — LOPERAMIDE HCL 2 MG PO CAPS: 2.0000 mg | ORAL_CAPSULE | Freq: Four times a day (QID) | ORAL | 0 refills | Status: DC | PRN

## 2021-11-09 MED ORDER — ONDANSETRON 4 MG PO TBDP
4.0000 mg | ORAL_TABLET | Freq: Once | ORAL | Status: AC
  Administered 2021-11-09: 4 mg via ORAL

## 2021-11-09 MED ORDER — ONDANSETRON 4 MG PO TBDP
ORAL_TABLET | ORAL | Status: AC
  Filled 2021-11-09: qty 1

## 2021-11-09 MED ORDER — ONDANSETRON 8 MG PO TBDP: 8.0000 mg | ORAL_TABLET | Freq: Three times a day (TID) | ORAL | 0 refills | Status: DC | PRN

## 2021-11-09 NOTE — ED Provider Notes (Signed)
[facility]    CSN: [id number] Arrival date & time: 11/09/21  1452      History   Chief Complaint Chief Complaint  Patient presents with   Emesis   Diarrhea   Chills    HPI [name] is a 36 y.o. male presenting with n/v/d, abd pain, subjective chills, myalgias x2 days. History alcohol use disorder, cannabis induced psychotic disorder. Describes many episodes of bilious vomit and watery diarrhea, unable to quantify. Never any hematemesis, melena, hematochezia. Tolerating some fluids. Denies known sick contacts, eating out, recent abx. States he did consume 3 alcoholic beverages 1 day ago, and typically drinks 1-3 alcoholic beverages daily.  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   MDD (major depressive disorder), recurrent episode, severe (HCC) 01/15/2021   MDD (major depressive disorder), recurrent severe, without psychosis (HCC) 05/24/2020   Depression, major, recurrent, severe with psychosis (HCC)    Suicidal ideation    Perianal abscess 03/20/2017    Past Surgical History:  Procedure Laterality Date   IRRIGATION AND DEBRIDEMENT ABSCESS Right 03/20/2017   Procedure: IRRIGATION AND DEBRIDEMENT ABSCESS , EUA, DRAIN PLACEMENT;  Surgeon: Andria Meuse, MD;  Location[facility];  Service: General;  Laterality: Right;       Home Medications    Prior to Admission medications   Medication Sig Start Date End Date Taking? Authorizing Provider  loperamide (IMODIUM) 2 MG capsule Take 1 capsule (2 mg total) by mouth 4 (four) times daily as needed  for diarrhea or loose stools. 11/09/21  Yes Rhys Martini, PA-C  ondansetron (ZOFRAN-ODT) 8 MG disintegrating tablet Take 1 tablet (8 mg total) by mouth every 8 (eight) hours as needed for nausea or vomiting. 11/09/21  Yes Rhys Martini, PA-C  hydrOXYzine (ATARAX/VISTARIL) 25 MG tablet Take 1 tablet (25 mg total) by mouth 3 (three) times daily as needed for anxiety. 01/18/21   Armandina Stammer I, NP  nicotine (NICODERM CQ - DOSED IN MG/24 HOURS) 14 mg/24hr patch Place 1 patch (14 mg total) onto the skin daily. (May buy from over the counter): For smoking cessation 01/19/21   Armandina Stammer I, NP  promethazine-dextromethorphan (PROMETHAZINE-DM) 6.25-15 MG/5ML syrup Take 5 mLs by mouth 4 (four) times daily as needed for cough. 07/11/21   Mardella Layman, MD    Family History Family History  Family history unknown: Yes    Social History Social History   Tobacco Use   Smoking status: Every Day    Packs/day: 1.00    Types: Cigarettes   Smokeless tobacco: Never  Vaping Use   Vaping Use: Never used  Substance Use Topics   Alcohol use: Yes    Alcohol/week: 14.0 standard drinks of alcohol    Types: 12 Cans of beer, 2 Shots of liquor per week    Comment: Per pt he drinks vodka occassionally & drinks 12 pkt beer Q 2 days"   Drug use: Yes    Types: Marijuana     Allergies   Banana and Tomato   Review of Systems Review of Systems  Constitutional:  Negative for appetite change, chills, diaphoresis, fever and unexpected weight change.  HENT:  Negative for congestion, ear pain, sinus pressure, sinus pain, sneezing, sore throat and trouble swallowing.   Respiratory:  Negative for cough, chest tightness and shortness of breath.   Cardiovascular:  Negative for chest pain.  Gastrointestinal:  Positive for abdominal pain, diarrhea, nausea and vomiting. Negative for abdominal distention, anal bleeding, blood in stool, constipation and rectal pain.  Genitourinary:  Negative for dysuria, flank pain,  frequency and urgency.  Musculoskeletal:  Negative for back pain and myalgias.  Neurological:  Negative for dizziness, light-headedness and headaches.  All other systems reviewed and are negative.    Physical Exam Triage Vital Signs ED Triage Vitals  Enc Vitals Group     BP 11/09/21 1525 124/81     Pulse Rate 11/09/21 1525 65     Resp 11/09/21 1525 15     Temp 11/09/21 1525 98.4 F (36.9 C)     Temp Source 11/09/21 1525 Oral     SpO2 11/09/21 1525 96 %     Weight --      Height --      Head Circumference --      Peak Flow --      Pain Score 11/09/21 1524 9     Pain Loc --      Pain Edu? --      Excl. in [place]? --    No data found.  Updated Vital Signs BP 124/81 (BP Location: Right Arm)   Pulse 65   Temp 98.4 F (36.9 C) (Oral)   Resp 15   SpO2 96%   Visual Acuity Right Eye Distance:   Left Eye Distance:   Bilateral Distance:    Right Eye Near:   Left Eye Near:    Bilateral Near:     Physical Exam Vitals reviewed.  Constitutional:      General: He is not in acute distress.    Appearance: Normal appearance. He is not ill-appearing.  HENT:     Head: Normocephalic and atraumatic.     Mouth/Throat:     Mouth: Mucous membranes are moist.     Comments: Moist mucous membranes Eyes:     Extraocular Movements: Extraocular movements intact.     Pupils: Pupils are equal, round, and reactive to light.  Cardiovascular:     Rate and Rhythm: Normal rate and regular rhythm.     Heart sounds: Normal heart sounds.  Pulmonary:     Effort: Pulmonary effort is normal.     Breath sounds: Normal breath sounds. No wheezing, rhonchi or rales.  Abdominal:     General: Bowel sounds are increased. There is no distension.     Palpations: Abdomen is soft. There is no mass.     Tenderness: There is generalized abdominal tenderness and tenderness in the left upper quadrant. There is no right CVA tenderness, left CVA tenderness, guarding or rebound. Negative signs include Murphy's  sign, Rovsing's sign and McBurney's sign.     Comments: Generalized TTP, no guarding rebound mass or hernia. Pain is worst in the LUQ, but patient is comfortable throughout exam. No obvious mass, hernia.  Skin:    General: Skin is warm.     Capillary Refill: Capillary refill takes less than 2 seconds.     Comments: Good skin turgor  Neurological:     General: No focal deficit present.     Mental Status: He is alert and oriented to person, place, and time.  Psychiatric:        Mood and Affect: Mood normal.        Behavior: Behavior normal.      UC Treatments / Results  Labs (all labs ordered are listed, but only abnormal results are displayed) Labs Reviewed  COMPREHENSIVE METABOLIC PANEL  CBC  LIPASE, BLOOD    EKG   Radiology No results found.  Procedures Procedures (including critical care time)  Medications Ordered in UC Medications  ondansetron (ZOFRAN-ODT) disintegrating tablet 4 mg (has no administration in time range)    Initial Impression / Assessment and Plan / UC Course  I have reviewed the triage vital signs and the nursing notes.  Pertinent labs & imaging results that were available during my care of the patient were reviewed by me and considered in my medical decision making (see chart for details).     This patient is a very pleasant 36 y.o. year old male presenting with n/v/d. Afebrile, nontachy. There is abd tenderness throughout, but worst in the LUQ; patient is comfortable throughout exam and there is no guarding rebound or palpable mass. He does drink etoh. Will manage with zofran ODT and imodium. Will check a CBC, CMP, and lipase given etoh consumption. No recent abx, so low concern for c dif. Pt still has his appendix and gallbladder; minimal RUQ and RLQ pain. ED return precautions discussed. Patient verbalizes understanding and agreement.   Final Clinical Impressions(s) / UC Diagnoses   Final diagnoses:  Nausea vomiting and diarrhea      Discharge Instructions      -Take the Zofran (ondansetron) up to 3 times daily for nausea and vomiting. Dissolve one pill under your tongue or between your teeth and your cheek. -Take the Imodium (loperamide) up to 4 times daily for diarrhea. -Drink plenty of fluids and eat a bland diet -We'll call with any abnormal lab results  -Follow-up if symptoms worsen - you can't keep fluids down, severe abdominal pain, you vomit red or black, etc.     ED Prescriptions     Medication Sig Dispense Auth. Provider   ondansetron (ZOFRAN-ODT) 8 MG disintegrating tablet Take 1 tablet (8 mg total) by mouth every 8 (eight) hours as needed for nausea or vomiting. 20 tablet Rhys Martini, PA-C   loperamide (IMODIUM) 2 MG capsule Take 1 capsule (2 mg total) by mouth 4 (four) times daily as needed for diarrhea or loose stools. 12 capsule Rhys Martini, PA-C      PDMP not reviewed this encounter.   Gamble, [place], PA-C 11/09/21 1625

## 2021-11-09 NOTE — Discharge Instructions (Addendum)
-  Take the Zofran (ondansetron) up to 3 times daily for nausea and vomiting. Dissolve one pill under your tongue or between your teeth and your cheek. -Take the Imodium (loperamide) up to 4 times daily for diarrhea. -Drink plenty of fluids and eat a bland diet -We'll call with any abnormal lab results  -Follow-up if symptoms worsen - you can't keep fluids down, severe abdominal pain, you vomit red or black, etc.

## 2021-11-09 NOTE — ED Triage Notes (Signed)
Pt reports having n/v/d all night with chills and body aches. Motrin didn't help with body aches

## 2021-11-19 ENCOUNTER — Ambulatory Visit (HOSPITAL_COMMUNITY)
Admission: EM | Admit: 2021-11-19 | Discharge: 2021-11-19 | Disposition: A | Discharge status: Home / Self Care | Payer: Self-pay | Attending: Sports Medicine | Admitting: Sports Medicine

## 2021-11-19 ENCOUNTER — Encounter (HOSPITAL_COMMUNITY): Payer: Self-pay | Admitting: *Deleted

## 2021-11-19 ENCOUNTER — Ambulatory Visit (INDEPENDENT_AMBULATORY_CARE_PROVIDER_SITE_OTHER): Payer: Self-pay

## 2021-11-19 DIAGNOSIS — M545 Low back pain, unspecified: Secondary | ICD-10-CM

## 2021-11-19 MED ORDER — ACETAMINOPHEN 325 MG PO TABS
ORAL_TABLET | ORAL | Status: AC
  Filled 2021-11-19: qty 2

## 2021-11-19 MED ORDER — ACETAMINOPHEN 325 MG PO TABS
650.0000 mg | ORAL_TABLET | Freq: Once | ORAL | Status: AC
  Administered 2021-11-19: 650 mg via ORAL

## 2021-11-19 MED ORDER — CYCLOBENZAPRINE HCL 10 MG PO TABS: 10.0000 mg | ORAL_TABLET | Freq: Three times a day (TID) | ORAL | 0 refills | Status: DC
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
## 2021-11-23 ENCOUNTER — Emergency Department (HOSPITAL_COMMUNITY)
Admission: EM | Admit: 2021-11-23 | Discharge: 2021-11-23 | Disposition: A | Discharge status: Home / Self Care | Payer: Medicaid Other | Attending: Emergency Medicine | Admitting: Emergency Medicine

## 2021-11-23 ENCOUNTER — Encounter (HOSPITAL_COMMUNITY): Payer: Self-pay

## 2021-11-23 ENCOUNTER — Other Ambulatory Visit: Payer: Self-pay

## 2021-11-23 DIAGNOSIS — M545 Low back pain, unspecified: Secondary | ICD-10-CM | POA: Insufficient documentation

## 2021-11-23 MED ORDER — LIDOCAINE 5 % EX PTCH: 1.0000 | MEDICATED_PATCH | CUTANEOUS | 0 refills | Status: DC

## 2021-11-23 MED ORDER — LIDOCAINE 5 % EX PTCH
1.0000 | MEDICATED_PATCH | CUTANEOUS | Status: DC
  Administered 2021-11-23: 1 via TRANSDERMAL
  Filled 2021-11-23: qty 1
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
## 2023-09-17 ENCOUNTER — Encounter (HOSPITAL_COMMUNITY): Payer: Self-pay

## 2023-09-17 ENCOUNTER — Ambulatory Visit (HOSPITAL_COMMUNITY)
Admission: EM | Admit: 2023-09-17 | Discharge: 2023-09-17 | Disposition: A | Discharge status: Home / Self Care | Payer: MEDICAID | Attending: Family Medicine | Admitting: Family Medicine

## 2023-09-17 DIAGNOSIS — G8929 Other chronic pain: Secondary | ICD-10-CM | POA: Diagnosis not present

## 2023-09-17 DIAGNOSIS — M545 Low back pain, unspecified: Secondary | ICD-10-CM

## 2023-09-17 DIAGNOSIS — M5432 Sciatica, left side: Secondary | ICD-10-CM | POA: Diagnosis not present

## 2023-09-17 MED ORDER — METHYLPREDNISOLONE 4 MG PO TBPK: ORAL_TABLET | ORAL | 0 refills | Status: DC

## 2023-09-17 MED ORDER — KETOROLAC TROMETHAMINE 30 MG/ML IJ SOLN
30.0000 mg | Freq: Once | INTRAMUSCULAR | Status: AC
  Administered 2023-09-17: 30 mg via INTRAMUSCULAR

## 2023-09-17 MED ORDER — KETOROLAC TROMETHAMINE 30 MG/ML IJ SOLN
INTRAMUSCULAR | Status: AC
  Filled 2023-09-17: qty 1

## 2023-09-17 MED ORDER — CYCLOBENZAPRINE HCL 10 MG PO TABS: 10.0000 mg | ORAL_TABLET | Freq: Three times a day (TID) | ORAL | 0 refills | Status: DC

## 2023-09-17 NOTE — ED Triage Notes (Signed)
 Patient reports that he has been having bilateral lower back pain x 1 week. Patient reports that he has pain radiating down the left leg. Patient reports a history of sciatica.  Patient states he took Aleve  at 0630 today.
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
## 2023-09-17 NOTE — Discharge Instructions (Addendum)
 You were seen today for back and leg pain.  I have given you a shot of pain medication while here.  I have sent out a muscle relaxer as well.  This will make you tired/drowsy so please take when home and not driving.  I have sent out a steroid.  You may take tylenol  or motrin  for pain, as well as use a heating pad to your back.  For continued pain, please make an appointment with a back specialist, or find a primary care provider.  You may go to [email or web address] to find one near you.

## 2023-09-22 ENCOUNTER — Encounter: Payer: Self-pay | Admitting: Nurse Practitioner

## 2023-09-22 ENCOUNTER — Ambulatory Visit (INDEPENDENT_AMBULATORY_CARE_PROVIDER_SITE_OTHER): Payer: MEDICAID | Admitting: Nurse Practitioner

## 2023-09-22 VITALS — BP 128/74 | HR 61 | Temp 98.0°F | Wt 135.8 lb

## 2023-09-22 DIAGNOSIS — Z13220 Encounter for screening for lipoid disorders: Secondary | ICD-10-CM

## 2023-09-22 DIAGNOSIS — Z113 Encounter for screening for infections with a predominantly sexual mode of transmission: Secondary | ICD-10-CM

## 2023-09-22 DIAGNOSIS — Z0000 Encounter for general adult medical examination without abnormal findings: Secondary | ICD-10-CM

## 2023-09-22 DIAGNOSIS — Z1329 Encounter for screening for other suspected endocrine disorder: Secondary | ICD-10-CM | POA: Diagnosis not present

## 2023-09-22 DIAGNOSIS — M5442 Lumbago with sciatica, left side: Secondary | ICD-10-CM | POA: Diagnosis not present

## 2023-09-22 DIAGNOSIS — G8929 Other chronic pain: Secondary | ICD-10-CM

## 2023-09-22 MED ORDER — KETOROLAC TROMETHAMINE 30 MG/ML IJ SOLN
30.0000 mg | Freq: Once | INTRAMUSCULAR | Status: DC
Start: 2023-09-22 — End: 2023-09-22

## 2023-09-22 MED ORDER — DICLOFENAC SODIUM 75 MG PO TBEC: 75.0000 mg | DELAYED_RELEASE_TABLET | Freq: Two times a day (BID) | ORAL | 0 refills | Status: DC

## 2023-09-22 NOTE — Patient Instructions (Addendum)
 1. Chronic left-sided low back pain with left-sided sciatica (Primary)  - AMB referral to orthopedics   2. Thyroid disorder screen  - TSH  3. Screen for STD (sexually transmitted disease)  - RPR+HIV+GC+CT Panel - Chlamydia/Gonococcus/Trichomonas, NAA  4. Lipid screening  - Lipid Panel  5. Routine adult health maintenance  - CBC - Comprehensive metabolic panel with GFR
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
## 2023-09-24 ENCOUNTER — Encounter: Payer: Self-pay | Admitting: Physical Medicine and Rehabilitation

## 2023-09-24 ENCOUNTER — Ambulatory Visit (INDEPENDENT_AMBULATORY_CARE_PROVIDER_SITE_OTHER): Admitting: Physical Medicine and Rehabilitation

## 2023-09-24 DIAGNOSIS — M5442 Lumbago with sciatica, left side: Secondary | ICD-10-CM | POA: Diagnosis not present

## 2023-09-24 DIAGNOSIS — M5416 Radiculopathy, lumbar region: Secondary | ICD-10-CM

## 2023-09-24 DIAGNOSIS — G8929 Other chronic pain: Secondary | ICD-10-CM

## 2023-09-24 DIAGNOSIS — M7918 Myalgia, other site: Secondary | ICD-10-CM | POA: Diagnosis not present

## 2023-09-24 LAB — CHLAMYDIA/GONOCOCCUS/TRICHOMONAS, NAA
Chlamydia by NAA: NEGATIVE
Gonococcus by NAA: NEGATIVE
Trich vag by NAA: NEGATIVE

## 2023-09-24 MED ORDER — CYCLOBENZAPRINE HCL 10 MG PO TABS: 10.0000 mg | ORAL_TABLET | Freq: Every day | ORAL | 0 refills | Status: DC
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
## 2023-09-25 LAB — RPR+HIV+GC+CT PANEL
HIV Screen 4th Generation wRfx: NONREACTIVE
RPR Ser Ql: NONREACTIVE

## 2023-09-25 LAB — CBC
Hematocrit: 48.3 % (ref 37.5–51.0)
Hemoglobin: 16.5 g/dL (ref 13.0–17.7)
MCH: 33.3 pg — ABNORMAL HIGH (ref 26.6–33.0)
MCHC: 34.2 g/dL (ref 31.5–35.7)
MCV: 97 fL (ref 79–97)
Platelets: 278 10*3/uL (ref 150–450)
RBC: 4.96 x10E6/uL (ref 4.14–5.80)
RDW: 12.3 % (ref 11.6–15.4)
WBC: 8 10*3/uL (ref 3.4–10.8)

## 2023-09-25 LAB — COMPREHENSIVE METABOLIC PANEL WITH GFR
ALT: 15 IU/L (ref 0–44)
AST: 20 IU/L (ref 0–40)
Albumin: 5 g/dL (ref 4.1–5.1)
Alkaline Phosphatase: 65 IU/L (ref 44–121)
BUN/Creatinine Ratio: 11 (ref 9–20)
BUN: 12 mg/dL (ref 6–20)
Bilirubin Total: 0.8 mg/dL (ref 0.0–1.2)
CO2: 25 mmol/L (ref 20–29)
Calcium: 10.4 mg/dL — ABNORMAL HIGH (ref 8.7–10.2)
Chloride: 100 mmol/L (ref 96–106)
Creatinine, Ser: 1.13 mg/dL (ref 0.76–1.27)
Globulin, Total: 3.2 g/dL (ref 1.5–4.5)
Glucose: 86 mg/dL (ref 70–99)
Potassium: 4.3 mmol/L (ref 3.5–5.2)
Sodium: 140 mmol/L (ref 134–144)
Total Protein: 8.2 g/dL (ref 6.0–8.5)
eGFR: 86 mL/min/{1.73_m2} (ref >59)

## 2023-09-25 LAB — LIPID PANEL
Chol/HDL Ratio: 4.9 ratio (ref 0.0–5.0)
Cholesterol, Total: 257 mg/dL — ABNORMAL HIGH (ref 100–199)
HDL: 52 mg/dL (ref >39)
LDL Chol Calc (NIH): 183 mg/dL — ABNORMAL HIGH (ref 0–99)
Triglycerides: 121 mg/dL (ref 0–149)
VLDL Cholesterol Cal: 22 mg/dL (ref 5–40)

## 2023-09-25 LAB — TSH: TSH: 2.06 u[IU]/mL (ref 0.450–4.500)

## 2023-09-29 ENCOUNTER — Emergency Department (HOSPITAL_COMMUNITY)
Admission: EM | Admit: 2023-09-29 | Discharge: 2023-09-29 | Disposition: A | Discharge status: Home / Self Care | Attending: Emergency Medicine | Admitting: Emergency Medicine

## 2023-09-29 ENCOUNTER — Encounter (HOSPITAL_COMMUNITY): Payer: Self-pay

## 2023-09-29 DIAGNOSIS — R222 Localized swelling, mass and lump, trunk: Secondary | ICD-10-CM | POA: Diagnosis present

## 2023-09-29 DIAGNOSIS — D171 Benign lipomatous neoplasm of skin and subcutaneous tissue of trunk: Secondary | ICD-10-CM | POA: Diagnosis not present

## 2023-09-29 NOTE — Discharge Instructions (Signed)
 Please call and schedule an appointment with the surgery clinic listed above. Return if you develop any concerning symptoms such as fever without other source, pain or drainage at the site.

## 2023-09-29 NOTE — ED Triage Notes (Signed)
 Pt arrived reporting left side of upper back has a mass. Reports the mass has been there for years. States he has not had ins and was unable to evaluate. Denies any pain. No other concerns

## 2023-09-29 NOTE — ED Provider Notes (Signed)
 [place] EMERGENCY DEPARTMENT AT [facility] Provider Note   CSN: [id number] Arrival date & time: 09/29/23  9562     History  Chief Complaint  Patient presents with   Mass    Ronnie Hoover is a 38 y.o. male.  38 year old male presents today for concern of a mass that has been present over his left upper back for the past couple years but however recently has increased in size.  States he has not had this evaluated due to always being checked for some other concern and this would escape his mind.  He is last night a friend was looking at his back and recommended he have this checked.  This is not painful.  The history is provided by the patient. No language interpreter was used.       Home Medications Prior to Admission medications   Medication Sig Start Date End Date Taking? Authorizing Provider  cyclobenzaprine  (FLEXERIL ) 10 MG tablet Take 1 tablet (10 mg total) by mouth at bedtime. 09/24/23   Williams, Megan E, NP  diclofenac  (VOLTAREN ) 75 MG EC tablet Take 1 tablet (75 mg total) by mouth 2 (two) times daily. 09/22/23   Jerrlyn Morel, NP  methylPREDNISolone  (MEDROL  DOSEPAK) 4 MG TBPK tablet Take as directed Patient not taking: Reported on 09/22/2023 09/17/23   Lesle Ras, MD      Allergies    Banana, Tomato, and Other    Review of Systems   Review of Systems  Constitutional:  Negative for fever.  Musculoskeletal:  Negative for back pain.  All other systems reviewed and are negative.   Physical Exam Updated Vital Signs BP 129/82 (BP Location: Right Arm)   Pulse 79   Temp 98.1 F (36.7 C) (Oral)   Resp 16   SpO2 100%  Physical Exam Vitals and nursing note reviewed.  Constitutional:      General: He is not in acute distress.    Appearance: Normal appearance. He is not ill-appearing.  HENT:     Head: Normocephalic and atraumatic.     Nose: Nose normal.  Eyes:     Conjunctiva/sclera: Conjunctivae normal.  Cardiovascular:     Rate and Rhythm:  Normal rate and regular rhythm.  Pulmonary:     Effort: Pulmonary effort is normal. No respiratory distress.  Musculoskeletal:        General: No deformity. Normal range of motion.     Cervical back: Normal range of motion.  Skin:    Findings: No rash.     Comments: Large mass on left upper back.  Measures 5 inches x 2 inches.  It is soft, no fluctuance, no overlying erythema.  Nontender.  Neurological:     Mental Status: He is alert.     ED Results / Procedures / Treatments   Labs (all labs ordered are listed, but only abnormal results are displayed) Labs Reviewed - No data to display  EKG None  Radiology No results found.  Procedures Procedures    Medications Ordered in ED Medications - No data to display  ED Course/ Medical Decision Making/ A&P                                 Medical Decision Making  Patient presents with mass left upper back.  Exam overall reassuring.  Is soft and nontender.  I discussed the case with surgeon to see if they wanted any imaging prior  to outpatient follow-up.  They stated unless we are worried about symptoms emergent the imaging could be performed outpatient.  Patient given general surgery follow-up.  He is stable for discharge.  He voices understanding and is in agreement with the plan.  Discussed with attending.   Final Clinical Impression(s) / ED Diagnoses Final diagnoses:  Lipoma of torso    Rx / DC Orders ED Discharge Orders     None         Lucina Sabal, PA-C 09/29/23 0102    Deatra Face, MD 09/30/23 [phone]

## 2023-10-02 ENCOUNTER — Encounter: Payer: Self-pay | Admitting: Physical Medicine and Rehabilitation

## 2023-10-06 ENCOUNTER — Other Ambulatory Visit: Payer: Self-pay | Admitting: Nurse Practitioner

## 2023-10-06 ENCOUNTER — Other Ambulatory Visit: Payer: Self-pay

## 2023-10-06 ENCOUNTER — Ambulatory Visit: Attending: Physical Medicine and Rehabilitation | Admitting: Physical Therapy

## 2023-10-06 ENCOUNTER — Encounter: Payer: Self-pay | Admitting: Physical Therapy

## 2023-10-06 DIAGNOSIS — R2689 Other abnormalities of gait and mobility: Secondary | ICD-10-CM | POA: Insufficient documentation

## 2023-10-06 DIAGNOSIS — G8929 Other chronic pain: Secondary | ICD-10-CM | POA: Insufficient documentation

## 2023-10-06 DIAGNOSIS — M6281 Muscle weakness (generalized): Secondary | ICD-10-CM | POA: Diagnosis present

## 2023-10-06 DIAGNOSIS — M5442 Lumbago with sciatica, left side: Secondary | ICD-10-CM | POA: Diagnosis present

## 2023-10-06 DIAGNOSIS — M5416 Radiculopathy, lumbar region: Secondary | ICD-10-CM | POA: Diagnosis present

## 2023-10-06 DIAGNOSIS — M7918 Myalgia, other site: Secondary | ICD-10-CM | POA: Diagnosis present
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
## 2023-10-09 ENCOUNTER — Ambulatory Visit
Admission: RE | Admit: 2023-10-09 | Discharge: 2023-10-09 | Disposition: A | Discharge status: Home / Self Care | Source: Ambulatory Visit | Attending: Physical Medicine and Rehabilitation | Admitting: Physical Medicine and Rehabilitation

## 2023-10-09 DIAGNOSIS — M5416 Radiculopathy, lumbar region: Secondary | ICD-10-CM

## 2023-10-09 DIAGNOSIS — M5442 Lumbago with sciatica, left side: Secondary | ICD-10-CM

## 2023-10-09 DIAGNOSIS — M7918 Myalgia, other site: Secondary | ICD-10-CM

## 2023-10-16 ENCOUNTER — Ambulatory Visit: Admitting: Physical Therapy

## 2023-10-16 ENCOUNTER — Encounter: Payer: Self-pay | Admitting: Physical Therapy

## 2023-10-16 DIAGNOSIS — M5416 Radiculopathy, lumbar region: Secondary | ICD-10-CM | POA: Diagnosis not present

## 2023-10-16 DIAGNOSIS — M6281 Muscle weakness (generalized): Secondary | ICD-10-CM

## 2023-10-16 DIAGNOSIS — R2689 Other abnormalities of gait and mobility: Secondary | ICD-10-CM
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
## 2023-10-16 NOTE — Patient Instructions (Signed)

## 2023-10-17 ENCOUNTER — Telehealth: Payer: Self-pay

## 2023-10-22 ENCOUNTER — Ambulatory Visit: Admitting: Physical Medicine and Rehabilitation

## 2023-10-22 ENCOUNTER — Encounter: Payer: Self-pay | Admitting: Physical Medicine and Rehabilitation

## 2023-10-22 DIAGNOSIS — M5116 Intervertebral disc disorders with radiculopathy, lumbar region: Secondary | ICD-10-CM

## 2023-10-22 DIAGNOSIS — G8929 Other chronic pain: Secondary | ICD-10-CM | POA: Diagnosis not present

## 2023-10-22 DIAGNOSIS — M5442 Lumbago with sciatica, left side: Secondary | ICD-10-CM

## 2023-10-22 DIAGNOSIS — M5416 Radiculopathy, lumbar region: Secondary | ICD-10-CM

## 2023-10-22 NOTE — Progress Notes (Unsigned)
 Pain Scale   Average Pain 7 Patient advising his lower back pain is worse today he is here for MRI review.        +Driver, -BT, -Dye Allergies.
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
## 2023-10-22 NOTE — Telephone Encounter (Signed)
 Completed.

## 2023-10-23 ENCOUNTER — Ambulatory Visit: Admitting: Physical Therapy

## 2023-10-23 DIAGNOSIS — M5416 Radiculopathy, lumbar region: Secondary | ICD-10-CM

## 2023-10-23 DIAGNOSIS — M6281 Muscle weakness (generalized): Secondary | ICD-10-CM

## 2023-10-23 DIAGNOSIS — R2689 Other abnormalities of gait and mobility: Secondary | ICD-10-CM
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
## 2023-10-28 ENCOUNTER — Telehealth: Payer: Self-pay

## 2023-10-28 NOTE — Telephone Encounter (Signed)
Sent to Provider 

## 2023-10-29 ENCOUNTER — Telehealth: Payer: Self-pay

## 2023-10-29 NOTE — Telephone Encounter (Signed)
 Patient advised.

## 2023-10-30 ENCOUNTER — Ambulatory Visit: Admitting: Physical Therapy
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
## 2023-11-06 ENCOUNTER — Encounter: Payer: Self-pay | Admitting: Physical Therapy

## 2023-11-06 ENCOUNTER — Ambulatory Visit: Attending: Physical Medicine and Rehabilitation | Admitting: Physical Therapy

## 2023-11-06 DIAGNOSIS — R2689 Other abnormalities of gait and mobility: Secondary | ICD-10-CM | POA: Diagnosis present

## 2023-11-06 DIAGNOSIS — M5416 Radiculopathy, lumbar region: Secondary | ICD-10-CM | POA: Insufficient documentation

## 2023-11-06 DIAGNOSIS — M6281 Muscle weakness (generalized): Secondary | ICD-10-CM | POA: Diagnosis present
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
## 2023-11-06 NOTE — Patient Instructions (Signed)

## 2023-11-13 ENCOUNTER — Encounter: Admitting: Physical Therapy

## 2023-11-20 ENCOUNTER — Ambulatory Visit: Admitting: Physical Therapy
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
## 2023-11-24 ENCOUNTER — Encounter: Admitting: Physical Medicine and Rehabilitation

## 2023-12-09 ENCOUNTER — Ambulatory Visit: Attending: Physical Medicine and Rehabilitation | Admitting: Physical Therapy

## 2023-12-09 ENCOUNTER — Encounter: Payer: Self-pay | Admitting: Physical Therapy

## 2023-12-09 DIAGNOSIS — M5416 Radiculopathy, lumbar region: Secondary | ICD-10-CM | POA: Diagnosis present

## 2023-12-09 DIAGNOSIS — R2689 Other abnormalities of gait and mobility: Secondary | ICD-10-CM | POA: Insufficient documentation

## 2023-12-09 DIAGNOSIS — G8929 Other chronic pain: Secondary | ICD-10-CM | POA: Diagnosis present

## 2023-12-09 DIAGNOSIS — M6281 Muscle weakness (generalized): Secondary | ICD-10-CM | POA: Insufficient documentation

## 2023-12-09 DIAGNOSIS — M5442 Lumbago with sciatica, left side: Secondary | ICD-10-CM | POA: Insufficient documentation
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
## 2023-12-23 ENCOUNTER — Encounter: Admitting: Physical Therapy

## 2023-12-23 ENCOUNTER — Encounter: Payer: Self-pay | Admitting: Physical Therapy
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
## 2023-12-30 ENCOUNTER — Ambulatory Visit: Admitting: Physical Therapy

## 2024-01-06 ENCOUNTER — Ambulatory Visit: Attending: Physical Medicine and Rehabilitation | Admitting: Physical Therapy

## 2024-01-07 ENCOUNTER — Telehealth: Payer: Self-pay | Admitting: Physical Therapy

## 2024-01-07 NOTE — Telephone Encounter (Signed)
 Called and informed patient of missed visit and provided reminder of attendance policy.  Given multiple late cancels and ~1 months since last visit pt will be d/c'd and require new referral to schedule.

## 2024-01-15 ENCOUNTER — Ambulatory Visit: Admitting: Physical Therapy

## 2024-03-24 ENCOUNTER — Encounter: Payer: Self-pay | Admitting: Nurse Practitioner

## 2024-03-24 ENCOUNTER — Ambulatory Visit: Payer: Self-pay | Admitting: Nurse Practitioner

## 2024-03-24 VITALS — BP 135/90 | HR 73 | Wt 139.2 lb

## 2024-03-24 DIAGNOSIS — M5432 Sciatica, left side: Secondary | ICD-10-CM

## 2024-03-24 DIAGNOSIS — Z13220 Encounter for screening for lipoid disorders: Secondary | ICD-10-CM | POA: Diagnosis not present

## 2024-03-24 MED ORDER — KETOROLAC TROMETHAMINE 60 MG/2ML IM SOLN
60.0000 mg | Freq: Once | INTRAMUSCULAR | Status: AC
  Administered 2024-03-24: 60 mg via INTRAMUSCULAR

## 2024-03-24 NOTE — Progress Notes (Signed)
 Subjective   Patient ID: Ronnie Hoover, male    DOB: [birth year], 38 y.o.   MRN: [id number]  Chief Complaint  Patient presents with   Back Pain    Referring provider: No ref. provider found  Ronnie Hoover is a 38 y.o. male with Past Medical History: No date: Abscess 01/23: Allergy No date: Anxiety No date: Bronchitis 10/2018: Depression No date: Low back pain with bilateral sciatica, unspecified back pain  laterality, unspecified chronicity   HPI  Patient presents today for a follow-up visit.  He was recently seeing orthopedics and physical therapy for low back pain.  He had to quit going because of his work schedule.  We discussed that he does need to follow-up with them.  We may need to do intermittently but for him for his job.  He states that his back pain has returned but physical therapy was helping when he was going.  We will trial Toradol  injection in office today.  We do need to follow-up on hyperlipidemia as well. Denies f/c/s, n/v/d, hemoptysis, PND, leg swelling Denies chest pain or edema     Allergies  Allergen Reactions   Banana Anaphylaxis and Nausea And Vomiting   Tomato Anaphylaxis and Hives   Other     Watermelon     Immunization History  Administered Date(s) Administered   Influenza,inj,Quad PF,6+ Mos 03/21/2017   Pneumococcal Polysaccharide-23 03/21/2017    Tobacco History: Social History   Tobacco Use  Smoking Status Every Day   Current packs/day: 1.00   Average packs/day: 1 pack/day for 15.0 years (15.0 ttl pk-yrs)   Types: Cigarettes  Smokeless Tobacco Never   Ready to quit: Not Answered Counseling given: Not Answered   Outpatient Encounter Medications as of 03/24/2024  Medication Sig   cyclobenzaprine  (FLEXERIL ) 10 MG tablet Take 1 tablet (10 mg total) by mouth at bedtime.   diclofenac  (VOLTAREN ) 75 MG EC tablet Take 1 tablet (75 mg total) by mouth 2 (two) times daily.   methylPREDNISolone  (MEDROL  DOSEPAK) 4 MG TBPK tablet  Take as directed   Facility-Administered Encounter Medications as of 03/24/2024  Medication   ketorolac  (TORADOL ) injection 60 mg    Review of Systems  Review of Systems  Constitutional: Negative.   HENT: Negative.    Cardiovascular: Negative.   Gastrointestinal: Negative.   Allergic/Immunologic: Negative.   Neurological: Negative.   Psychiatric/Behavioral: Negative.       Objective:   BP (!) 135/90 (BP Location: Left Arm, Patient Position: Sitting, Cuff Size: Normal)   Pulse 73   Wt 139 lb 3.2 oz (63.1 kg)   SpO2 99%   BMI 18.37 kg/m   Wt Readings from Last 5 Encounters:  03/24/24 139 lb 3.2 oz (63.1 kg)  09/22/23 135 lb 12.8 oz (61.6 kg)  11/23/21 130 lb (59 kg)  01/14/21 140 lb (63.5 kg)  05/23/20 125 lb (56.7 kg)     Physical Exam Vitals and nursing note reviewed.  Constitutional:      General: He is not in acute distress.    Appearance: He is well-developed.  Cardiovascular:     Rate and Rhythm: Normal rate and regular rhythm.  Pulmonary:     Effort: Pulmonary effort is normal.     Breath sounds: Normal breath sounds.  Skin:    General: Skin is warm and dry.  Neurological:     Mental Status: He is alert and oriented to person, place, and time.       Assessment & Plan:  Lipid screening -     Lipid panel  Sciatica of left side -     Ketorolac  Tromethamine      Return in about 6 months (around 09/22/2024).   Ronnie GORMAN Borer, NP 03/24/2024

## 2024-03-25 LAB — LIPID PANEL
Chol/HDL Ratio: 6.1 ratio — ABNORMAL HIGH (ref 0.0–5.0)
Cholesterol, Total: 268 mg/dL — ABNORMAL HIGH (ref 100–199)
HDL: 44 mg/dL (ref >39)
LDL Chol Calc (NIH): 208 mg/dL — ABNORMAL HIGH (ref 0–99)
Triglycerides: 91 mg/dL (ref 0–149)
VLDL Cholesterol Cal: 16 mg/dL (ref 5–40)

## 2024-03-26 ENCOUNTER — Ambulatory Visit: Payer: Self-pay | Admitting: Nurse Practitioner

## 2024-03-26 MED ORDER — ROSUVASTATIN CALCIUM 5 MG PO TABS: 5.0000 mg | ORAL_TABLET | Freq: Every day | ORAL | 3 refills | Status: AC

## 2024-03-29 ENCOUNTER — Encounter: Payer: Self-pay | Admitting: Radiology

## 2024-04-03 ENCOUNTER — Emergency Department (HOSPITAL_COMMUNITY)

## 2024-04-03 ENCOUNTER — Encounter (HOSPITAL_COMMUNITY): Payer: Self-pay

## 2024-04-03 ENCOUNTER — Ambulatory Visit (HOSPITAL_COMMUNITY): Admission: EM | Admit: 2024-04-03 | Discharge: 2024-04-03 | Disposition: A | Discharge status: Home / Self Care

## 2024-04-03 ENCOUNTER — Emergency Department (HOSPITAL_COMMUNITY)
Admission: EM | Admit: 2024-04-03 | Discharge: 2024-04-03 | Disposition: A | Discharge status: Home / Self Care | Source: Ambulatory Visit | Attending: Emergency Medicine | Admitting: Emergency Medicine

## 2024-04-03 ENCOUNTER — Other Ambulatory Visit: Payer: Self-pay

## 2024-04-03 DIAGNOSIS — R072 Precordial pain: Secondary | ICD-10-CM | POA: Diagnosis present

## 2024-04-03 DIAGNOSIS — R0789 Other chest pain: Secondary | ICD-10-CM | POA: Insufficient documentation

## 2024-04-03 DIAGNOSIS — R051 Acute cough: Secondary | ICD-10-CM | POA: Insufficient documentation

## 2024-04-03 DIAGNOSIS — R0602 Shortness of breath: Secondary | ICD-10-CM

## 2024-04-03 DIAGNOSIS — R0981 Nasal congestion: Secondary | ICD-10-CM | POA: Diagnosis not present

## 2024-04-03 LAB — BASIC METABOLIC PANEL WITH GFR
Anion gap: 11 (ref 5–15)
BUN: 7 mg/dL (ref 6–20)
CO2: 22 mmol/L (ref 22–32)
Calcium: 9.5 mg/dL (ref 8.9–10.3)
Chloride: 104 mmol/L (ref 98–111)
Creatinine, Ser: 0.96 mg/dL (ref 0.61–1.24)
GFR, Estimated: >60 mL/min (ref >60)
Glucose, Bld: 100 mg/dL — ABNORMAL HIGH (ref 70–99)
Potassium: 4.2 mmol/L (ref 3.5–5.1)
Sodium: 137 mmol/L (ref 135–145)

## 2024-04-03 LAB — CBC
HCT: 48 % (ref 39.0–52.0)
Hemoglobin: 16.6 g/dL (ref 13.0–17.0)
MCH: 33.3 pg (ref 26.0–34.0)
MCHC: 34.6 g/dL (ref 30.0–36.0)
MCV: 96.2 fL (ref 80.0–100.0)
Platelets: 315 K/uL (ref 150–400)
RBC: 4.99 MIL/uL (ref 4.22–5.81)
RDW: 12.6 % (ref 11.5–15.5)
WBC: 7.7 K/uL (ref 4.0–10.5)
nRBC: 0 % (ref 0.0–0.2)

## 2024-04-03 LAB — TROPONIN I (HIGH SENSITIVITY)
Troponin I (High Sensitivity): <2 ng/L (ref <18)
Troponin I (High Sensitivity): <2 ng/L (ref <18)

## 2024-04-03 MED ORDER — TRIAMCINOLONE ACETONIDE 55 MCG/ACT NA AERO: 2.0000 | INHALATION_SPRAY | Freq: Every day | NASAL | 12 refills | Status: AC

## 2024-04-03 MED ORDER — IBUPROFEN 600 MG PO TABS: 600.0000 mg | ORAL_TABLET | Freq: Four times a day (QID) | ORAL | 0 refills | Status: AC | PRN

## 2024-04-03 NOTE — Discharge Instructions (Signed)
 Return to ER if you have any new or worsening symptoms.

## 2024-04-03 NOTE — Discharge Instructions (Addendum)
  1. Substernal chest pain (Primary) 2. Shortness of breath - ED EKG performed in UC shows sinus bradycardia, ventricular rate of 58 bpm, otherwise normal EKG, no STEMI. - Due to UC x-ray being unavailable, will direct patient to emergency department for further evaluation and treatment with chest x-ray and stat laboratory testing due to chest pain and shortness of breath. - After leaving urgent care to go directly to [facility] emergency department for further evaluation and treatment.

## 2024-04-03 NOTE — ED Provider Notes (Signed)
 [facility]  Note:  This document was prepared using Conservation officer, historic buildings and may include unintentional dictation errors.  MRN: [id number] DOB: 1986-02-22  Subjective:   Ronnie Hoover is a 38 y.o. male presenting for evaluation of midsternal/substernal chest pain since last night.  Patient reports pain does not radiate to his arms or neck.  Patient states that taking a deep breath, talking excessively or mild exertion causes increased substernal chest pain and shortness of breath.  Patient reports shortness of breath this morning around 5:30 AM which woke him from his sleep.  Patient reports that shortness of breath is now resolved but he is still having chest pain.  Patient denies any history of ACS or previous MI.  Patient does admit to having high cholesterol and currently taking medication.  Patient denies any dizziness, weakness, fever, cough, sore throat, body aches, fatigue.  No current facility-administered medications for this encounter.  Current Outpatient Medications:    cyclobenzaprine  (FLEXERIL ) 10 MG tablet, Take 1 tablet (10 mg total) by mouth at bedtime., Disp: 30 tablet, Rfl: 0   diclofenac  (VOLTAREN ) 75 MG EC tablet, Take 1 tablet (75 mg total) by mouth 2 (two) times daily., Disp: 30 tablet, Rfl: 0   methylPREDNISolone  (MEDROL  DOSEPAK) 4 MG TBPK tablet, Take as directed, Disp: 1 each, Rfl: 0   rosuvastatin (CRESTOR) 5 MG tablet, Take 1 tablet (5 mg total) by mouth daily., Disp: 90 tablet, Rfl: 3   Allergies  Allergen Reactions   Banana Anaphylaxis and Nausea And Vomiting   Tomato Anaphylaxis and Hives   Other     Watermelon     Past Medical History:  Diagnosis Date   Abscess    Allergy 01/23   Anxiety    Bronchitis    Depression 10/2018   Low back pain with bilateral sciatica, unspecified back pain laterality, unspecified chronicity      Past Surgical History:  Procedure Laterality Date   IRRIGATION AND DEBRIDEMENT ABSCESS  Right 03/20/2017   Procedure: IRRIGATION AND DEBRIDEMENT ABSCESS , EUA, DRAIN PLACEMENT;  Surgeon: Teresa Lonni HERO, MD;  Location: [facility] OR;  Service: General;  Laterality: Right;    Family History  Problem Relation Age of Onset   Healthy Mother    Healthy Father     Social History   Tobacco Use   Smoking status: Former    Current packs/day: 1.00    Average packs/day: 1 pack/day for 15.0 years (15.0 ttl pk-yrs)    Types: Cigarettes   Smokeless tobacco: Never  Vaping Use   Vaping status: Former   Substances: Nicotine , Flavoring  Substance Use Topics   Alcohol use: Yes    Alcohol/week: 1.0 standard drink of alcohol    Types: 1 Cans of beer per week   Drug use: Yes    Types: Marijuana    ROS Refer to HPI for ROS details.  Objective:    Vitals: BP 116/73   Pulse 64   Temp 98.2 F (36.8 C) (Oral)   Resp 16   SpO2 94%   Physical Exam Vitals and nursing note reviewed.  Constitutional:      General: He is not in acute distress.    Appearance: Normal appearance. He is well-developed. He is not ill-appearing or toxic-appearing.  HENT:     Head: Normocephalic.     Nose: Congestion present. No rhinorrhea.     Mouth/Throat:     Mouth: Mucous membranes are moist.     Pharynx: Oropharynx is clear. No  posterior oropharyngeal erythema.  Cardiovascular:     Rate and Rhythm: Regular rhythm. Bradycardia present.     Heart sounds: Normal heart sounds. No murmur heard. Pulmonary:     Effort: Pulmonary effort is normal. No respiratory distress.     Breath sounds: No stridor. No wheezing.  Chest:     Chest wall: No tenderness.  Skin:    General: Skin is warm and dry.  Neurological:     General: No focal deficit present.     Mental Status: He is alert and oriented to person, place, and time.  Psychiatric:        Mood and Affect: Mood normal.        Behavior: Behavior normal.     Procedures  No results found for this or any previous visit (from the past 24  hours).  Assessment and Plan :     Discharge Instructions       1. Substernal chest pain (Primary) 2. Shortness of breath - ED EKG performed in UC shows sinus bradycardia, ventricular rate of 58 bpm, otherwise normal EKG, no STEMI. - Due to UC x-ray being unavailable, will direct patient to emergency department for further evaluation and treatment with chest x-ray and stat laboratory testing due to chest pain and shortness of breath. - After leaving urgent care to go directly to [facility] emergency department for further evaluation and treatment.      Koralee Wedeking B Charlotte Brafford   Taniqua Issa B, NP 04/03/24 1000

## 2024-04-03 NOTE — ED Provider Notes (Signed)
 [place] EMERGENCY DEPARTMENT AT [facility] Provider Note   CSN: [id number] Arrival date & time: 04/03/24  9057     Patient presents with: Chest Pain   Ronnie Hoover is a 38 y.o. male.    Chest Pain  Ronnie Hoover is a 38 y.o. male presenting for evaluation of midsternal/substernal chest pain since last night.  He states that for the past few days he has had some sinus congestion and cough he has not had any hemoptysis he states that he feels short of breath but indicates that this is forcing him to mouth breathe because he has sinus congestion.  He has had some moments of chest tightness and discomfort he denies any sharp or stabbing pain but does indicate that this is worse with breathing.  No nausea or vomiting, no exertional symptoms, no history of heart disease, he does follow with a primary care provider and was recently initiated on statin for elevated cholesterol  No recent surgeries, hospitalization, long travel, hemoptysis, estrogen containing OCP, cancer history.  No unilateral leg swelling.  No history of PE or VTE.     Prior to Admission medications   Medication Sig Start Date End Date Taking? Authorizing Provider  ibuprofen  (ADVIL ) 600 MG tablet Take 1 tablet (600 mg total) by mouth every 6 (six) hours as needed. 04/03/24  Yes Mclain Freer S, PA  triamcinolone (NASACORT) 55 MCG/ACT AERO nasal inhaler Place 2 sprays into the nose daily. 04/03/24  Yes Twylla Arceneaux S, PA  cyclobenzaprine  (FLEXERIL ) 10 MG tablet Take 1 tablet (10 mg total) by mouth at bedtime. 09/24/23   Williams, Megan E, NP  diclofenac  (VOLTAREN ) 75 MG EC tablet Take 1 tablet (75 mg total) by mouth 2 (two) times daily. 09/22/23   Oley Bascom RAMAN, NP  methylPREDNISolone  (MEDROL  DOSEPAK) 4 MG TBPK tablet Take as directed 09/17/23   Darral Longs, MD  rosuvastatin (CRESTOR) 5 MG tablet Take 1 tablet (5 mg total) by mouth daily. 03/26/24   Oley Bascom RAMAN, NP    Allergies: Banana, Tomato,  and Other    Review of Systems  Cardiovascular:  Positive for chest pain.    Updated Vital Signs BP 112/71   Pulse 63   Temp 98 F (36.7 C)   Resp 20   SpO2 99%   Physical Exam Vitals and nursing note reviewed.  Constitutional:      General: He is not in acute distress. HENT:     Head: Normocephalic and atraumatic.     Nose: Nose normal.     Comments: Boggy nasal mucosa with congested mucous membranes Eyes:     General: No scleral icterus. Cardiovascular:     Rate and Rhythm: Normal rate and regular rhythm.     Pulses: Normal pulses.     Heart sounds: Normal heart sounds.  Pulmonary:     Effort: Pulmonary effort is normal. No respiratory distress.     Breath sounds: No wheezing.  Abdominal:     Palpations: Abdomen is soft.     Tenderness: There is no abdominal tenderness. There is no guarding or rebound.  Musculoskeletal:     Cervical back: Normal range of motion.     Right lower leg: No edema.     Left lower leg: No edema.  Skin:    General: Skin is warm and dry.     Capillary Refill: Capillary refill takes less than 2 seconds.  Neurological:     Mental Status: He is alert. Mental  status is at baseline.  Psychiatric:        Mood and Affect: Mood normal.        Behavior: Behavior normal.     (all labs ordered are listed, but only abnormal results are displayed) Labs Reviewed  BASIC METABOLIC PANEL WITH GFR - Abnormal; Notable for the following components:      Result Value   Glucose, Bld 100 (*)    All other components within normal limits  CBC  TROPONIN I (HIGH SENSITIVITY)  TROPONIN I (HIGH SENSITIVITY)    EKG: EKG Interpretation Date/Time:  Saturday April 03 2024 09:53:45 EST Ventricular Rate:  56 PR Interval:  136 QRS Duration:  84 QT Interval:  414 QTC Calculation: 399 R Axis:   84  Text Interpretation: Sinus bradycardia Minimal voltage criteria for LVH, may be normal variant ( Cornell product ) Borderline ECG When compared with ECG of  15-Jan-2021 14:20, PREVIOUS ECG IS PRESENT No significant change since last tracing Confirmed by Ronnie Hoover [phone]) on 04/03/2024 11:20:06 AM  Radiology: [facility] Chest 2 View Result Date: 04/03/2024 EXAM: 2 VIEW(S) XRAY OF THE CHEST 04/03/2024 10:38:07 AM COMPARISON: 01/14/21. CLINICAL HISTORY: chest pain FINDINGS: LUNGS AND PLEURA: No focal pulmonary opacity. No pulmonary edema. No pleural effusion. No pneumothorax. HEART AND MEDIASTINUM: No acute abnormality of the cardiac and mediastinal silhouettes. BONES AND SOFT TISSUES: No acute osseous abnormality. IMPRESSION: 1. No acute cardiopulmonary process. Electronically signed by: Waddell Calk MD 04/03/2024 11:04 AM EST RP Workstation: [id number]     Procedures   Medications Ordered in the ED - No data to display                                  Medical Decision Making Amount and/or Complexity of Data Reviewed Labs: ordered. Radiology: ordered.  Risk OTC drugs. Prescription drug management.   This patient presents to the ED for concern of CP, this involves a number of treatment options, and is a complaint that carries with it a moderate to high risk of complications and morbidity. A differential diagnosis was considered for the patient's symptoms which is discussed below:   The emergent causes of chest pain include: Acute coronary syndrome, tamponade, pericarditis/myocarditis, aortic dissection, pulmonary embolism, tension pneumothorax, pneumonia, and esophageal rupture.  I do not believe the patient has an emergent cause of chest pain, other urgent/non-acute considerations include, but are not limited to: chronic angina, aortic stenosis, cardiomyopathy, mitral valve prolapse, pulmonary hypertension, aortic insufficiency, right ventricular hypertrophy, pleuritis, bronchitis, pneumothorax, tumor, gastroesophageal reflux disease (GERD), esophageal spasm, Mallory-Weiss syndrome, peptic ulcer disease, pancreatitis, functional gastrointestinal  pain, cervical or thoracic disk disease or arthritis, shoulder arthritis, costochondritis, subacromial bursitis, anxiety or panic attack, herpes zoster, breast disorders, chest wall tumors, thoracic outlet syndrome, mediastinitis.    Co morbidities: Discussed in HPI   Brief History:  Ronnie Hoover is a 38 y.o. male presenting for evaluation of midsternal/substernal chest pain since last night.  He states that for the past few days he has had some sinus congestion and cough he has not had any hemoptysis he states that he feels short of breath but indicates that this is forcing him to mouth breathe because he has sinus congestion.  He has had some moments of chest tightness and discomfort he denies any sharp or stabbing pain but does indicate that this is worse with breathing.  No nausea or vomiting, no exertional symptoms, no history of  heart disease, he does follow with a primary care provider and was recently initiated on statin for elevated cholesterol  No recent surgeries, hospitalization, long travel, hemoptysis, estrogen containing OCP, cancer history.  No unilateral leg swelling.  No history of PE or VTE.     EMR reviewed including pt PMHx, past surgical history and past visits to ER.   See HPI for more details   Lab Tests:  I personally reviewed all laboratory work and imaging. Metabolic panel without any acute abnormality specifically kidney function within normal limits and no significant electrolyte abnormalities. CBC without leukocytosis or significant anemia.   Imaging Studies:  NAD. I personally reviewed all imaging studies and no acute abnormality found. I agree with radiology interpretation.    Cardiac Monitoring:  The patient was maintained on a cardiac monitor.  I personally viewed and interpreted the cardiac monitored which showed an underlying rhythm of: NSR EKG non-ischemic   Medicines ordered:  No meds ordered   Critical  Interventions:     Consults/Attending Physician      Reevaluation:  After the interventions noted above I re-evaluated patient and found that they have :stayed the same   Social Determinants of Health:      Problem List / ED Course:  Patient is a 38 year old male with no pertinent past medical history apart from elevated cholesterol present emergency room today with some sinus congestion and shortness of breath when breathing through his nose and feeling fatigued.  I do lengthy shared decision-making conversation with him about obtaining a D-dimer versus not.  He ambulated with pulse oximeter at 100% without dyspnea or chest pain and overall is well-appearing we made a shared decision to not obtain a dimer.  Very strict in regards to the emergency room for any hemoptysis chest pain difficulty breathing that is worsened.   Dispostion:  After consideration of the diagnostic results and the patients response to treatment, I feel that the patent would benefit from outpatient follow up with return precautions.    Final diagnoses:  Atypical chest pain  Acute cough  Sinus congestion    ED Discharge Orders          Ordered    triamcinolone (NASACORT) 55 MCG/ACT AERO nasal inhaler  Daily        04/03/24 1319    ibuprofen  (ADVIL ) 600 MG tablet  Every 6 hours PRN        04/03/24 1319               Neldon Albion, [place] 04/03/24 1551    Ronnie Clarity, MD 04/03/24 1553

## 2024-04-03 NOTE — ED Triage Notes (Signed)
 Pt c/o non radiating midsternal chest pain started last night. Pt states when he walks, talks or inhales he has the midsternal chest tightness. Pt states had SOB around 0530, but has resolved. Pt denies N/V

## 2024-04-03 NOTE — ED Triage Notes (Signed)
 Pt here POV from UC with central chest tightness onset yesterday, worsening today. Sent here for blood work and chest xray.

## 2024-04-09 ENCOUNTER — Encounter: Payer: Self-pay | Admitting: Nurse Practitioner

## 2024-04-09 ENCOUNTER — Ambulatory Visit (INDEPENDENT_AMBULATORY_CARE_PROVIDER_SITE_OTHER): Payer: Self-pay | Admitting: Nurse Practitioner

## 2024-04-09 VITALS — BP 127/71 | HR 75 | Wt 141.6 lb

## 2024-04-09 DIAGNOSIS — R079 Chest pain, unspecified: Secondary | ICD-10-CM

## 2024-04-09 MED ORDER — OMEPRAZOLE 20 MG PO CPDR: 20.0000 mg | DELAYED_RELEASE_CAPSULE | Freq: Every day | ORAL | 3 refills | Status: AC

## 2024-04-09 MED ORDER — FEXOFENADINE HCL 180 MG PO TABS: 180.0000 mg | ORAL_TABLET | Freq: Every day | ORAL | 0 refills | Status: AC

## 2024-04-09 NOTE — Progress Notes (Signed)
 Subjective   Patient ID: Ronnie Hoover, male    DOB: [birth year], 38 y.o.   MRN: [id number]  Chief Complaint  Patient presents with   Hospitalization Follow-up    Atypical chest pain, cough, congestion, still has the chest pains, not as bad since saturday   Referral    Cardiologist     Referring provider: Oley Bascom RAMAN, NP  Ronnie Hoover is a 38 y.o. male with Past Medical History: No date: Abscess 01/23: Allergy No date: Anxiety No date: Bronchitis 10/2018: Depression No date: Low back pain with bilateral sciatica, unspecified back pain  laterality, unspecified chronicity   HPI  Patient presents today with ED follow-up.  Patient was seen in the ED for atypical chest pain, cough, congestion.  He states that he does still have intermittent chest pain.  We will place referral to cardiology today.  Will start allergy medication for postnasal drip and sinus pressure.  Denies f/c/s, n/v/d, hemoptysis, PND, leg swelling Denies chest pain or edema     Allergies  Allergen Reactions   Banana Anaphylaxis and Nausea And Vomiting   Tomato Anaphylaxis and Hives   Other     Watermelon     Immunization History  Administered Date(s) Administered   Influenza,inj,Quad PF,6+ Mos 03/21/2017   Pneumococcal Polysaccharide-23 03/21/2017    Tobacco History: Social History   Tobacco Use  Smoking Status Former   Current packs/day: 1.00   Average packs/day: 1 pack/day for 15.0 years (15.0 ttl pk-yrs)   Types: Cigarettes  Smokeless Tobacco Never   Counseling given: Not Answered   Outpatient Encounter Medications as of 04/09/2024  Medication Sig   cyclobenzaprine  (FLEXERIL ) 10 MG tablet Take 1 tablet (10 mg total) by mouth at bedtime.   diclofenac  (VOLTAREN ) 75 MG EC tablet Take 1 tablet (75 mg total) by mouth 2 (two) times daily.   fexofenadine  (ALLEGRA  ALLERGY) 180 MG tablet Take 1 tablet (180 mg total) by mouth daily.   ibuprofen  (ADVIL ) 600 MG tablet Take 1 tablet  (600 mg total) by mouth every 6 (six) hours as needed.   omeprazole  (PRILOSEC) 20 MG capsule Take 1 capsule (20 mg total) by mouth daily.   rosuvastatin  (CRESTOR ) 5 MG tablet Take 1 tablet (5 mg total) by mouth daily.   triamcinolone  (NASACORT ) 55 MCG/ACT AERO nasal inhaler Place 2 sprays into the nose daily. (Patient not taking: Reported on 04/14/2024)   [DISCONTINUED] methylPREDNISolone  (MEDROL  DOSEPAK) 4 MG TBPK tablet Take as directed   No facility-administered encounter medications on file as of 04/09/2024.    Review of Systems  Review of Systems  Constitutional: Negative.   HENT:  Positive for postnasal drip, sinus pressure and sinus pain.   Respiratory:  Positive for cough and shortness of breath.   Cardiovascular: Negative.   Gastrointestinal: Negative.   Allergic/Immunologic: Negative.   Neurological: Negative.   Psychiatric/Behavioral: Negative.       Objective:   BP 127/71 (BP Location: Left Arm, Patient Position: Sitting, Cuff Size: Normal)   Pulse 75   Wt 141 lb 9.6 oz (64.2 kg)   SpO2 98%   BMI 18.68 kg/m   Wt Readings from Last 5 Encounters:  04/14/24 142 lb 12.8 oz (64.8 kg)  04/09/24 141 lb 9.6 oz (64.2 kg)  03/24/24 139 lb 3.2 oz (63.1 kg)  09/22/23 135 lb 12.8 oz (61.6 kg)  11/23/21 130 lb (59 kg)     Physical Exam Vitals and nursing note reviewed.  Constitutional:  General: He is not in acute distress.    Appearance: He is well-developed.  HENT:     Nose: Congestion present.  Cardiovascular:     Rate and Rhythm: Normal rate and regular rhythm.  Pulmonary:     Effort: Pulmonary effort is normal.     Breath sounds: Normal breath sounds.  Skin:    General: Skin is warm and dry.  Neurological:     Mental Status: He is alert and oriented to person, place, and time.       Assessment & Plan:   Intermittent chest pain -     Ambulatory referral to Cardiology  Other orders -     Fexofenadine  HCl; Take 1 tablet (180 mg total) by mouth  daily.  Dispense: 90 tablet; Refill: 0 -     Omeprazole ; Take 1 capsule (20 mg total) by mouth daily.  Dispense: 30 capsule; Refill: 3     Return in about 3 months (around 07/10/2024).   Bascom GORMAN Borer, NP 04/19/2024

## 2024-04-14 ENCOUNTER — Ambulatory Visit: Attending: Nurse Practitioner | Admitting: Nurse Practitioner

## 2024-04-14 VITALS — BP 110/70 | HR 61 | Ht 72.0 in | Wt 142.8 lb

## 2024-04-14 DIAGNOSIS — R001 Bradycardia, unspecified: Secondary | ICD-10-CM | POA: Diagnosis not present

## 2024-04-14 DIAGNOSIS — R9431 Abnormal electrocardiogram [ECG] [EKG]: Secondary | ICD-10-CM | POA: Diagnosis not present

## 2024-04-14 DIAGNOSIS — Z720 Tobacco use: Secondary | ICD-10-CM

## 2024-04-14 DIAGNOSIS — R072 Precordial pain: Secondary | ICD-10-CM

## 2024-04-14 DIAGNOSIS — R0602 Shortness of breath: Secondary | ICD-10-CM | POA: Diagnosis not present

## 2024-04-14 NOTE — Progress Notes (Signed)
 Office Visit    Patient Name: Ronnie Hoover Date of Encounter: 04/19/2024  Primary Care Provider:  Oley Bascom RAMAN, NP Primary Cardiologist:  Kardie Tobb, DO  Chief Complaint    38 year old male with a history of chest pain, hyperlipidemia, anxiety, depression and tobacco use who presents for [facility] new patient evaluation.  Past Medical History    Past Medical History:  Diagnosis Date   Abscess    Allergy 01/23   Anxiety    Bronchitis    Depression 10/2018   Low back pain with bilateral sciatica, unspecified back pain laterality, unspecified chronicity    Past Surgical History:  Procedure Laterality Date   IRRIGATION AND DEBRIDEMENT ABSCESS Right 03/20/2017   Procedure: IRRIGATION AND DEBRIDEMENT ABSCESS , EUA, DRAIN PLACEMENT;  Surgeon: Teresa Lonni HERO, MD;  Location: [facility] OR;  Service: General;  Laterality: Right;    Allergies  Allergies  Allergen Reactions   Banana Anaphylaxis and Nausea And Vomiting   Tomato Anaphylaxis and Hives   Other     Watermelon      Labs/Other Studies Reviewed    The following studies were reviewed today:     Recent Labs: 09/22/2023: ALT 15; TSH 2.060 04/03/2024: BUN 7; Creatinine, Ser 0.96; Hemoglobin 16.6; Platelets 315; Potassium 4.2; Sodium 137  Recent Lipid Panel    Component Value Date/Time   CHOL 201 (H) 04/15/2024 1238   TRIG 62 04/15/2024 1238   HDL 46 04/15/2024 1238   CHOLHDL 4.4 04/15/2024 1238   CHOLHDL 5.7 01/16/2021 0617   VLDL 21 01/16/2021 0617   LDLCALC 144 (H) 04/15/2024 1238    History of Present Illness    38 year old male with the above past medical history including chest pain, hyperlipidemia, anxiety, depression, and tobacco use.  He was seen in the ED on 04/03/2024 in the setting of midsternal/substernal chest pain.  Pain was worse with deep breaths, exertion.  He reported associated shortness of breath. Troponin was negative x 2.  Chest x-ray was unremarkable.  EKG showed sinus  bradycardia, 56 bpm, borderline LVH, no acute ST/T wave changes.  He was discharged home in stable condition and advised to follow-up with cardiology as an outpatient.  He presents today for [facility] first clinic new patient evaluation.  Since his ED visit he has noted nearly constant chest discomfort, which he describes as an aching, worse when sitting up and with deep breaths.  He has had some relief with ibuprofen . Vaping made his symptoms worse.  He did have an upper respiratory infection in early September that lasted a few days. He has still noted some nasal congestion, sinus pressure, and cough, as well as mild shortness of breath with exertion.  He denies any palpitations, dizziness, presyncope or syncope.  He does have a history of prior syncope as a teenager.  He drinks caffeine 1 to 2 cups of coffee a day.  He previously smoked 1 to 2 packs/day (since the age of 10), he recently switched to vaping but recently quit this as it gave him worsening chest pain.  He drinks 1-2 beers a day.  He smokes marijuana daily.  He has 2 children ages 4 and 41.  He lives with his sister, he is not married.  He currently works as a conservation officer, nature.  He denies any significant family history.     Home Medications    Current Outpatient Medications  Medication Sig Dispense Refill   cyclobenzaprine  (FLEXERIL ) 10 MG tablet Take 1 tablet (10 mg  total) by mouth at bedtime. 30 tablet 0   diclofenac  (VOLTAREN ) 75 MG EC tablet Take 1 tablet (75 mg total) by mouth 2 (two) times daily. 30 tablet 0   fexofenadine  (ALLEGRA  ALLERGY) 180 MG tablet Take 1 tablet (180 mg total) by mouth daily. 90 tablet 0   ibuprofen  (ADVIL ) 600 MG tablet Take 1 tablet (600 mg total) by mouth every 6 (six) hours as needed. 30 tablet 0   omeprazole  (PRILOSEC) 20 MG capsule Take 1 capsule (20 mg total) by mouth daily. 30 capsule 3   rosuvastatin  (CRESTOR ) 5 MG tablet Take 1 tablet (5 mg total) by mouth daily. 90 tablet 3   triamcinolone  (NASACORT ) 55  MCG/ACT AERO nasal inhaler Place 2 sprays into the nose daily. (Patient not taking: Reported on 04/14/2024) 1 each 12   No current facility-administered medications for this visit.     Review of Systems    He denies palpitations, pnd, orthopnea, n, v, dizziness, syncope, edema, weight gain, or early satiety. All other systems reviewed and are otherwise negative except as noted above.   Physical Exam    VS:  BP 110/70   Pulse 61   Ht 6' (1.829 m)   Wt 142 lb 12.8 oz (64.8 kg)   SpO2 98%   BMI 19.37 kg/m   GEN: Well nourished, well developed, in no acute distress. HEENT: normal. Neck: Supple, no JVD, carotid bruits, or masses. Cardiac: RRR, no murmurs, rubs, or gallops. No clubbing, cyanosis, edema.  Radials/DP/PT 2+ and equal bilaterally.  Respiratory:  Respirations regular and unlabored, clear to auscultation bilaterally. GI: Soft, nontender, nondistended, BS + x 4. MS: no deformity or atrophy. Skin: warm and dry, no rash. Neuro:  Strength and sensation are intact. Psych: Normal affect.  Accessory Clinical Findings    ECG personally reviewed by me today -    - no EKG in office today. EKG on 04/03/2024 reviewed, sinus bradycardia, 56 bpm, borderline LVH, no acute ST/T wave changes.   Lab Results  Component Value Date   WBC 7.7 04/03/2024   HGB 16.6 04/03/2024   HCT 48.0 04/03/2024   MCV 96.2 04/03/2024   PLT 315 04/03/2024   Lab Results  Component Value Date   CREATININE 0.96 04/03/2024   BUN 7 04/03/2024   NA 137 04/03/2024   K 4.2 04/03/2024   CL 104 04/03/2024   CO2 22 04/03/2024   Lab Results  Component Value Date   ALT 15 09/22/2023   AST 20 09/22/2023   ALKPHOS 65 09/22/2023   BILITOT 0.8 09/22/2023   Lab Results  Component Value Date   CHOL 201 (H) 04/15/2024   HDL 46 04/15/2024   LDLCALC 144 (H) 04/15/2024   TRIG 62 04/15/2024   CHOLHDL 4.4 04/15/2024    Lab Results  Component Value Date   HGBA1C 5.6 01/16/2021    Assessment & Plan    1.  Chest pain/shortness of breath/sinus bradycardia: Seen in the ED on 04/03/2024 in the setting of substernal chest pain, shortness of breath.  Workup was unremarkable.  EKG showed sinus bradycardia, borderline LVH, no acute changes.  Chest pain has been nearly constant, he describes it as an aching, worse with sitting up and with deep breaths.  Some relief with ibuprofen .  He reports a lingering respiratory infection, some mild shortness of breath with exertion.  Denies any other symptoms. Euvolemic and well compensated on exam. Will check ESR, CRP, stat echo given concern for possible pericarditis. Symptoms are overall atypical  for angina, however, given risk factors of hyperlipidemia, tobacco use, abnormal EKG, through shared decision making, and for further risk startification, will pursue coronary CT angiogram. Recent BMET stable.  Reviewed ED precautions.  2. Hyperlipidemia:  LDL was 208 in 02/2024.  He does not recall whether he was fasting at this time.  Will update fasting lipid panel, CMET prior to next visit.    3. Tobacco use/marijuana use: He has smoked since the age of 27, currently vapes.  He also smokes marijuana daily.  Full cessation advised.  4. Disposition: Follow-up in 6 to 8 weeks with Dr. Sheena.    Damien JAYSON Braver, NP 04/19/2024, 5:18 PM

## 2024-04-14 NOTE — Patient Instructions (Signed)
 Medication Instructions:  Your physician recommends that you continue on your current medications as directed. Please refer to the Current Medication list given to you today.  *If you need a refill on your cardiac medications before your next appointment, please call your pharmacy*  Lab Work: TODAY:  ESR & CRP  COME BACK IN A DAY OR SO, FASTING, FOR:  LIPID  If you have labs (blood work) drawn today and your tests are completely normal, you will receive your results only by: MyChart Message (if you have MyChart) OR A paper copy in the mail If you have any lab test that is abnormal or we need to change your treatment, we will call you to review the results.  Testing/Procedures: Your physician has requested that you have an echocardiogram STAT. Echocardiography is a painless test that uses sound waves to create images of your heart. It provides your doctor with information about the size and shape of your heart and how well your heart's chambers and valves are working. This procedure takes approximately one hour. There are no restrictions for this procedure. Please do NOT wear cologne, perfume, aftershave, or lotions (deodorant is allowed). Please arrive 15 minutes prior to your appointment time.  Please note: We ask at that you not bring children with you during ultrasound (echo/ vascular) testing. Due to room size and safety concerns, children are not allowed in the ultrasound rooms during exams. Our front office staff cannot provide observation of children in our lobby area while testing is being conducted. An adult accompanying a patient to their appointment will only be allowed in the ultrasound room at the discretion of the ultrasound technician under special circumstances. We apologize for any inconvenience.  Your physician has requested that you have cardiac CT. Cardiac computed tomography (CT) is a painless test that uses an x-ray machine to take clear, detailed pictures of your heart.  For further information please visit [email or web address] Please follow instruction sheet BELOW:    Your cardiac CT will be scheduled at one of the below locations:   [facility] [address] [place], [place] [address] [phone] (Severe contrast allergies only)  OR   [facility] [address] [place], [place] [address] [phone]  OR   [facility] [address] [place], [place] [address] [phone]  OR   [facility] and Vascular Tower [address]  [place], [place] [address]  OR   [facility] [address] [place], [place] [phone]  If scheduled at [facility], please arrive at the [facility] and Children's Entrance (Entrance C2) of [facility] 30 minutes prior to test start time. You can use the FREE valet parking offered at entrance C (encouraged to control the heart rate for the test)  Proceed to the [facility] Radiology Department (first floor) to check-in and test prep.  All radiology patients and guests should use entrance C2 at [facility], accessed from [facility], even though the hospital's physical address listed is [address]  If scheduled at the Heart and Vascular Tower at [address], please enter the parking lot using the [address] entrance and use the FREE valet service at the patient drop-off area. Enter the building and check-in with registration on the main floor.  If scheduled at [facility], please arrive to the Heart and Vascular Center 15 mins early for check-in and test prep.  There is spacious parking  and easy access to the radiology department from the [facility] Heart and Vascular entrance. Please enter here and check-in with the desk attendant.   If scheduled at [facility], please arrive 30 minutes early for check-in and test prep.  Please follow these instructions  carefully (unless otherwise directed):  An IV will be required for this test and Nitroglycerin will be given.  Hold all erectile dysfunction medications at least 3 days (72 hrs) prior to test. (Ie viagra, cialis, sildenafil, tadalafil, etc)   On the Night Before the Test: Be sure to Drink plenty of water. Do not consume any caffeinated/decaffeinated beverages or chocolate 12 hours prior to your test. Do not take any antihistamines 12 hours prior to your test.  If the patient has contrast allergy: Patient will need a prescription for Prednisone  and very clear instructions (as follows): Prednisone  50 mg - take 13 hours prior to test Take another Prednisone  50 mg 7 hours prior to test Take another Prednisone  50 mg 1 hour prior to test Take Benadryl 50 mg 1 hour prior to test Patient must complete all four doses of above prophylactic medications. Patient will need a ride after test due to Benadryl.  On the Day of the Test: Drink plenty of water until 1 hour prior to the test. Do not eat any food 1 hour prior to test. You may take your regular medications prior to the test.     After the Test: Drink plenty of water. After receiving IV contrast, you may experience a mild flushed feeling. This is normal. On occasion, you may experience a mild rash up to 24 hours after the test. This is not dangerous. If this occurs, you can take Benadryl 25 mg, Zyrtec, Claritin, or Allegra  and increase your fluid intake. (Patients taking Tikosyn should avoid Benadryl, and may take Zyrtec, Claritin, or Allegra ) If you experience trouble breathing, this can be serious. If it is severe call 911 IMMEDIATELY. If it is mild, please call our office.  We will call to schedule your test 2-4 weeks out understanding that some insurance companies will need an authorization prior to the service being performed.   For more information and frequently asked questions, please visit our website :  [email or web address]  For non-scheduling related questions, please contact the cardiac imaging nurse navigator should you have any questions/concerns: Cardiac Imaging Nurse Navigators Direct Office Dial: [phone]   For scheduling needs, including cancellations and rescheduling, please call Brittany, [phone].   Follow-Up: At [facility], you and your health needs are our priority.  As part of our continuing mission to provide you with exceptional heart care, our providers are all part of one team.  This team includes your primary Cardiologist (physician) and Advanced Practice Providers or APPs (Physician Assistants and Nurse Practitioners) who all work together to provide you with the care you need, when you need it.  Your next appointment:   6-8 week(s)  Provider:   Kardie Tobb, DO or Damien Braver, NP          We recommend signing up for the patient portal called MyChart.  Sign up information is provided on this After Visit Summary.  MyChart is used to connect with patients for Virtual Visits (Telemedicine).  Patients are able to view lab/test results, encounter notes, upcoming appointments, etc.  Non-urgent messages can be sent to your provider as well.   To learn more about what you can do with MyChart, go to [email or web address].   Other Instructions

## 2024-04-15 LAB — SEDIMENTATION RATE: Sed Rate: 6 mm/h (ref 0–15)

## 2024-04-15 LAB — C-REACTIVE PROTEIN: CRP: <1 mg/L (ref 0–10)

## 2024-04-16 LAB — LIPID PANEL
Chol/HDL Ratio: 4.4 ratio (ref 0.0–5.0)
Cholesterol, Total: 201 mg/dL — ABNORMAL HIGH (ref 100–199)
HDL: 46 mg/dL (ref >39)
LDL Chol Calc (NIH): 144 mg/dL — ABNORMAL HIGH (ref 0–99)
Triglycerides: 62 mg/dL (ref 0–149)
VLDL Cholesterol Cal: 11 mg/dL (ref 5–40)

## 2024-04-19 ENCOUNTER — Encounter: Payer: Self-pay | Admitting: Nurse Practitioner

## 2024-04-26 ENCOUNTER — Ambulatory Visit: Payer: Self-pay | Admitting: Nurse Practitioner

## 2024-04-26 DIAGNOSIS — Z79899 Other long term (current) drug therapy: Secondary | ICD-10-CM

## 2024-04-26 DIAGNOSIS — R931 Abnormal findings on diagnostic imaging of heart and coronary circulation: Secondary | ICD-10-CM

## 2024-04-27 ENCOUNTER — Telehealth: Payer: Self-pay | Admitting: Nurse Practitioner

## 2024-04-27 NOTE — Telephone Encounter (Signed)
 Paperwork was indexed and placed in providers folder on 04/13/2024. We have a 7-10 business day policy and today makes the tenth business day.  Copied from CRM #[id number]. Topic: General - Other >> Apr 27, 2024  9:58 AM Amy B wrote: Reason for CRM: Patient asks that his FMLA paperwork be refaxed to [employer].  They informed him that they still have not received it.  Please fax to [phone]

## 2024-05-05 ENCOUNTER — Encounter (HOSPITAL_COMMUNITY): Payer: Self-pay

## 2024-05-07 ENCOUNTER — Ambulatory Visit (HOSPITAL_COMMUNITY)
Admission: RE | Admit: 2024-05-07 | Discharge: 2024-05-07 | Disposition: A | Source: Ambulatory Visit | Attending: Nurse Practitioner | Admitting: Nurse Practitioner

## 2024-05-07 DIAGNOSIS — R0602 Shortness of breath: Secondary | ICD-10-CM | POA: Diagnosis not present

## 2024-05-07 DIAGNOSIS — R9431 Abnormal electrocardiogram [ECG] [EKG]: Secondary | ICD-10-CM

## 2024-05-07 DIAGNOSIS — R072 Precordial pain: Secondary | ICD-10-CM | POA: Diagnosis not present

## 2024-05-07 MED ORDER — IOHEXOL 350 MG/ML SOLN
100.0000 mL | Freq: Once | INTRAVENOUS | Status: AC | PRN
  Administered 2024-05-07: 100 mL via INTRAVENOUS

## 2024-05-07 MED ORDER — NITROGLYCERIN 0.4 MG SL SUBL
0.8000 mg | SUBLINGUAL_TABLET | Freq: Once | SUBLINGUAL | Status: AC
  Administered 2024-05-07: 0.8 mg via SUBLINGUAL

## 2024-05-07 NOTE — Progress Notes (Signed)
Pt denies any symptoms  

## 2024-05-11 ENCOUNTER — Telehealth: Payer: Self-pay | Admitting: Nurse Practitioner

## 2024-05-11 NOTE — Telephone Encounter (Signed)
 [employer] requesting prior auth for CT scan Fax: [phone]  Reference [phone]

## 2024-05-24 ENCOUNTER — Ambulatory Visit (HOSPITAL_COMMUNITY)
Admission: RE | Admit: 2024-05-24 | Discharge: 2024-05-24 | Disposition: A | Discharge status: Home / Self Care | Source: Ambulatory Visit | Attending: Nurse Practitioner | Admitting: Nurse Practitioner

## 2024-05-24 DIAGNOSIS — R9431 Abnormal electrocardiogram [ECG] [EKG]: Secondary | ICD-10-CM | POA: Insufficient documentation

## 2024-05-24 DIAGNOSIS — R0602 Shortness of breath: Secondary | ICD-10-CM | POA: Diagnosis present

## 2024-05-24 DIAGNOSIS — R079 Chest pain, unspecified: Secondary | ICD-10-CM | POA: Diagnosis not present

## 2024-05-24 DIAGNOSIS — R072 Precordial pain: Secondary | ICD-10-CM | POA: Insufficient documentation

## 2024-05-24 LAB — ECHOCARDIOGRAM COMPLETE
Area-P 1/2: 3.99 cm2
S' Lateral: 3 cm

## 2024-05-24 MED ORDER — PERFLUTREN LIPID MICROSPHERE
1.0000 mL | INTRAVENOUS | Status: AC | PRN
  Administered 2024-05-24: 2 mL via INTRAVENOUS

## 2024-06-07 ENCOUNTER — Encounter (HOSPITAL_COMMUNITY): Payer: Self-pay

## 2024-06-08 ENCOUNTER — Other Ambulatory Visit: Payer: Self-pay | Admitting: Nurse Practitioner

## 2024-06-08 ENCOUNTER — Ambulatory Visit (HOSPITAL_COMMUNITY)
Admission: RE | Admit: 2024-06-08 | Discharge: 2024-06-08 | Disposition: A | Discharge status: Home / Self Care | Source: Ambulatory Visit | Attending: Nurse Practitioner | Admitting: Nurse Practitioner

## 2024-06-08 DIAGNOSIS — R931 Abnormal findings on diagnostic imaging of heart and coronary circulation: Secondary | ICD-10-CM | POA: Insufficient documentation

## 2024-06-08 MED ORDER — GADOBUTROL 1 MMOL/ML IV SOLN
9.0000 mL | Freq: Once | INTRAVENOUS | Status: AC | PRN
  Administered 2024-06-08: 9 mL via INTRAVENOUS

## 2024-06-17 ENCOUNTER — Ambulatory Visit: Payer: Self-pay | Admitting: Nurse Practitioner

## 2024-06-24 ENCOUNTER — Ambulatory Visit: Payer: Self-pay

## 2024-06-24 ENCOUNTER — Encounter: Payer: Self-pay | Admitting: Nurse Practitioner

## 2024-06-24 ENCOUNTER — Ambulatory Visit: Admitting: Nurse Practitioner

## 2024-06-24 VITALS — BP 105/55 | HR 73 | Temp 96.4°F | Wt 141.0 lb

## 2024-06-24 DIAGNOSIS — G8929 Other chronic pain: Secondary | ICD-10-CM

## 2024-06-24 DIAGNOSIS — M5442 Lumbago with sciatica, left side: Secondary | ICD-10-CM | POA: Diagnosis not present

## 2024-06-24 MED ORDER — CYCLOBENZAPRINE HCL 10 MG PO TABS: 10.0000 mg | ORAL_TABLET | Freq: Every day | ORAL | 0 refills | Status: AC

## 2024-06-24 MED ORDER — PREDNISONE 10 MG PO TABS: ORAL_TABLET | ORAL | 0 refills | Status: AC

## 2024-06-24 MED ORDER — KETOROLAC TROMETHAMINE 60 MG/2ML IM SOLN
60.0000 mg | Freq: Once | INTRAMUSCULAR | Status: AC
  Administered 2024-06-24: 60 mg via INTRAMUSCULAR

## 2024-06-24 MED ORDER — DICLOFENAC SODIUM 75 MG PO TBEC: 75.0000 mg | DELAYED_RELEASE_TABLET | Freq: Two times a day (BID) | ORAL | 0 refills | Status: AC

## 2024-06-24 NOTE — Progress Notes (Signed)
 "  Subjective   Patient ID: Ronnie Hoover, male    DOB: 1985-06-05, 39 y.o.   MRN: [id number]  Chief Complaint  Patient presents with   Sciatica    X 5 days. Worsen over time.     Referring provider: Oley Bascom RAMAN, NP  Ronnie Hoover is a 39 y.o. male with Past Medical History: No date: Abscess 01/23: Allergy No date: Anxiety No date: Bronchitis 10/2018: Depression No date: Low back pain with bilateral sciatica, unspecified back pain  laterality, unspecified chronicity   HPI  Patient presents today for an acute visit.  He has been having a flareup of his sciatica for the past week.  Patient does have chronic sciatica to the left side.  He has done physical therapy for this in the past.  He has been doing home physical therapy exercises.  He has not been taking any medication.  We will refill medications today.  We will trial prednisone  and give patient Toradol  injection in office today. Denies f/c/s, n/v/d, hemoptysis, PND, leg swelling Denies chest pain or edema     Allergies[1]  Immunization History  Administered Date(s) Administered   Influenza,inj,Quad PF,6+ Mos 03/21/2017   Pneumococcal Polysaccharide-23 03/21/2017    Tobacco History: Tobacco Use History[2] Counseling given: Not Answered   Outpatient Encounter Medications as of 06/24/2024  Medication Sig   ibuprofen  (ADVIL ) 600 MG tablet Take 1 tablet (600 mg total) by mouth every 6 (six) hours as needed.   predniSONE  (DELTASONE ) 10 MG tablet Take 4 tabs for 2 days, then 3 tabs for 2 days, then 2 tabs for 2 days, then 1 tab for 2 days, then stop   cyclobenzaprine  (FLEXERIL ) 10 MG tablet Take 1 tablet (10 mg total) by mouth at bedtime.   diclofenac  (VOLTAREN ) 75 MG EC tablet Take 1 tablet (75 mg total) by mouth 2 (two) times daily.   fexofenadine  (ALLEGRA  ALLERGY) 180 MG tablet Take 1 tablet (180 mg total) by mouth daily. (Patient not taking: Reported on 06/24/2024)   omeprazole  (PRILOSEC) 20 MG capsule  Take 1 capsule (20 mg total) by mouth daily. (Patient not taking: Reported on 06/24/2024)   rosuvastatin  (CRESTOR ) 5 MG tablet Take 1 tablet (5 mg total) by mouth daily. (Patient not taking: Reported on 06/24/2024)   triamcinolone  (NASACORT ) 55 MCG/ACT AERO nasal inhaler Place 2 sprays into the nose daily. (Patient not taking: Reported on 06/24/2024)   [DISCONTINUED] cyclobenzaprine  (FLEXERIL ) 10 MG tablet Take 1 tablet (10 mg total) by mouth at bedtime. (Patient not taking: Reported on 06/24/2024)   [DISCONTINUED] diclofenac  (VOLTAREN ) 75 MG EC tablet Take 1 tablet (75 mg total) by mouth 2 (two) times daily. (Patient not taking: Reported on 06/24/2024)   [EXPIRED] ketorolac  (TORADOL ) injection 60 mg    No facility-administered encounter medications on file as of 06/24/2024.    Review of Systems  Review of Systems  Constitutional: Negative.   HENT: Negative.    Cardiovascular: Negative.   Gastrointestinal: Negative.   Musculoskeletal:  Positive for back pain.  Allergic/Immunologic: Negative.   Neurological: Negative.   Psychiatric/Behavioral: Negative.       Objective:   BP (!) 105/55   Pulse 73   Temp (!) 96.4 F (35.8 C) (Temporal)   Wt 141 lb (64 kg)   SpO2 100%   BMI 19.12 kg/m   Wt Readings from Last 5 Encounters:  06/24/24 141 lb (64 kg)  04/14/24 142 lb 12.8 oz (64.8 kg)  04/09/24 141 lb 9.6 oz (64.2 kg)  03/24/24 139 lb 3.2 oz (63.1 kg)  09/22/23 135 lb 12.8 oz (61.6 kg)     Physical Exam Vitals and nursing note reviewed.  Constitutional:      General: He is not in acute distress.    Appearance: He is well-developed.  Cardiovascular:     Rate and Rhythm: Normal rate and regular rhythm.  Pulmonary:     Effort: Pulmonary effort is normal.     Breath sounds: Normal breath sounds.  Musculoskeletal:     Lumbar back: Tenderness present.  Skin:    General: Skin is warm and dry.  Neurological:     Mental Status: He is alert and oriented to person, place, and time.        Assessment & Plan:   Chronic left-sided low back pain with left-sided sciatica -     Cyclobenzaprine  HCl; Take 1 tablet (10 mg total) by mouth at bedtime.  Dispense: 30 tablet; Refill: 0 -     Diclofenac  Sodium; Take 1 tablet (75 mg total) by mouth 2 (two) times daily.  Dispense: 30 tablet; Refill: 0 -     predniSONE ; Take 4 tabs for 2 days, then 3 tabs for 2 days, then 2 tabs for 2 days, then 1 tab for 2 days, then stop  Dispense: 20 tablet; Refill: 0 -     Ketorolac  Tromethamine      Return if symptoms worsen or fail to improve.     Bascom GORMAN Borer, NP 06/24/2024     [1]  Allergies Allergen Reactions   Banana Anaphylaxis and Nausea And Vomiting   Tomato Anaphylaxis and Hives   Other     Watermelon   [2]  Social History Tobacco Use  Smoking Status Former   Current packs/day: 1.00   Average packs/day: 1 pack/day for 15.0 years (15.0 ttl pk-yrs)   Types: Cigarettes  Smokeless Tobacco Never   "

## 2024-06-24 NOTE — Telephone Encounter (Signed)
 FYI Only or Action Required?: FYI only for provider: appointment scheduled on 06/24/24.  Patient was last seen in primary care on 04/09/2024 by Oley Bascom RAMAN, NP.  Called Nurse Triage reporting Back Pain.  Symptoms began several days ago.  Interventions attempted: OTC medications: Tylenol  3 and Other: stretching.  Symptoms are: gradually worsening.  Triage Disposition: See HCP Within 4 Hours (Or PCP Triage)  Patient/caregiver understands and will follow disposition?: Yes Reason for Disposition  [1] SEVERE back pain (e.g., excruciating, unable to do any normal activities) AND [2] not improved 2 hours after pain medicine  Answer Assessment - Initial Assessment Questions Pt reports sciatica flare-up x 5 days. Experiencing pinching sensation in his left lower back with movement or bending. Pt has attempted stretching with no improvement, rated at 10/10.  1. ONSET: When did the pain begin? (e.g., minutes, hours, days)     06/20/24 2. LOCATION: Where does it hurt? (upper, mid or lower back)     Lower left back 3. SEVERITY: How bad is the pain?  (e.g., Scale 1-10; mild, moderate, or severe)     10/10 4. PATTERN: Is the pain constant? (e.g., yes, no; constant, intermittent)      Constant, worse with movement or bending 5. RADIATION: Does the pain shoot into your legs or somewhere else?     Denies 6. CAUSE:  What do you think is causing the back pain?      Unknown 7. BACK OVERUSE:  Any recent lifting of heavy objects, strenuous work or exercise?     Worse when at work/after work 8. MEDICINES: What have you taken so far for the pain?      Stretching that he learned in PT  Protocols used: Back Pain-A-AH  Message from [place] R sent at 06/24/2024  8:05 AM EST  Reason for Triage: Patient states his sciatica is flaring up and he is in a lot pain in his lower left back. Pain is so bad he is unable to bend over.

## 2024-06-25 NOTE — Telephone Encounter (Signed)
 Folder is still up front with paperwork in it. CB.

## 2024-06-29 ENCOUNTER — Telehealth: Payer: Self-pay | Admitting: Nurse Practitioner

## 2024-09-22 ENCOUNTER — Ambulatory Visit: Payer: Self-pay | Admitting: Nurse Practitioner
